# Patient Record
Sex: Female | Born: 1935 | Race: Black or African American | Hispanic: No | Smoking: Never smoker
Health system: Southern US, Community
[De-identification: ages and names within clinical notes are randomized; demographics above are authoritative.]

## PROBLEM LIST (undated history)

## (undated) DIAGNOSIS — I10 Essential (primary) hypertension: Secondary | ICD-10-CM

## (undated) DIAGNOSIS — R42 Dizziness and giddiness: Secondary | ICD-10-CM

## (undated) DIAGNOSIS — J45909 Unspecified asthma, uncomplicated: Secondary | ICD-10-CM

## (undated) DIAGNOSIS — M1990 Unspecified osteoarthritis, unspecified site: Secondary | ICD-10-CM

## (undated) DIAGNOSIS — K219 Gastro-esophageal reflux disease without esophagitis: Secondary | ICD-10-CM

## (undated) DIAGNOSIS — K859 Acute pancreatitis without necrosis or infection, unspecified: Secondary | ICD-10-CM

## (undated) DIAGNOSIS — K5792 Diverticulitis of intestine, part unspecified, without perforation or abscess without bleeding: Secondary | ICD-10-CM

## (undated) DIAGNOSIS — M5430 Sciatica, unspecified side: Secondary | ICD-10-CM

## (undated) DIAGNOSIS — E079 Disorder of thyroid, unspecified: Secondary | ICD-10-CM

## (undated) HISTORY — PX: THYROIDECTOMY: SHX17

---

## 2016-10-30 ENCOUNTER — Observation Stay (HOSPITAL_COMMUNITY)
Admission: EM | Admit: 2016-10-30 | Discharge: 2016-10-31 | Disposition: A | Discharge status: Home / Self Care | Payer: Medicare Other | Attending: Family Medicine | Admitting: Family Medicine

## 2016-10-30 ENCOUNTER — Observation Stay (HOSPITAL_COMMUNITY): Payer: Medicare Other

## 2016-10-30 ENCOUNTER — Encounter (HOSPITAL_COMMUNITY): Payer: Self-pay | Admitting: Emergency Medicine

## 2016-10-30 ENCOUNTER — Emergency Department (HOSPITAL_COMMUNITY): Payer: Medicare Other

## 2016-10-30 ENCOUNTER — Other Ambulatory Visit (HOSPITAL_COMMUNITY): Payer: Self-pay

## 2016-10-30 DIAGNOSIS — R531 Weakness: Secondary | ICD-10-CM | POA: Insufficient documentation

## 2016-10-30 DIAGNOSIS — R945 Abnormal results of liver function studies: Secondary | ICD-10-CM | POA: Diagnosis not present

## 2016-10-30 DIAGNOSIS — Z880 Allergy status to penicillin: Secondary | ICD-10-CM | POA: Diagnosis not present

## 2016-10-30 DIAGNOSIS — Z7982 Long term (current) use of aspirin: Secondary | ICD-10-CM | POA: Diagnosis not present

## 2016-10-30 DIAGNOSIS — J329 Chronic sinusitis, unspecified: Secondary | ICD-10-CM | POA: Insufficient documentation

## 2016-10-30 DIAGNOSIS — R778 Other specified abnormalities of plasma proteins: Secondary | ICD-10-CM | POA: Diagnosis present

## 2016-10-30 DIAGNOSIS — R55 Syncope and collapse: Principal | ICD-10-CM | POA: Insufficient documentation

## 2016-10-30 DIAGNOSIS — Z79899 Other long term (current) drug therapy: Secondary | ICD-10-CM | POA: Insufficient documentation

## 2016-10-30 DIAGNOSIS — R600 Localized edema: Secondary | ICD-10-CM | POA: Diagnosis not present

## 2016-10-30 DIAGNOSIS — K219 Gastro-esophageal reflux disease without esophagitis: Secondary | ICD-10-CM | POA: Insufficient documentation

## 2016-10-30 DIAGNOSIS — R42 Dizziness and giddiness: Secondary | ICD-10-CM | POA: Diagnosis not present

## 2016-10-30 DIAGNOSIS — R7989 Other specified abnormal findings of blood chemistry: Secondary | ICD-10-CM | POA: Diagnosis present

## 2016-10-30 DIAGNOSIS — I10 Essential (primary) hypertension: Secondary | ICD-10-CM | POA: Diagnosis not present

## 2016-10-30 DIAGNOSIS — E039 Hypothyroidism, unspecified: Secondary | ICD-10-CM | POA: Diagnosis not present

## 2016-10-30 DIAGNOSIS — J45909 Unspecified asthma, uncomplicated: Secondary | ICD-10-CM | POA: Insufficient documentation

## 2016-10-30 DIAGNOSIS — R748 Abnormal levels of other serum enzymes: Secondary | ICD-10-CM

## 2016-10-30 HISTORY — DX: Disorder of thyroid, unspecified: E07.9

## 2016-10-30 HISTORY — DX: Sciatica, unspecified side: M54.30

## 2016-10-30 HISTORY — DX: Diverticulitis of intestine, part unspecified, without perforation or abscess without bleeding: K57.92

## 2016-10-30 HISTORY — DX: Essential (primary) hypertension: I10

## 2016-10-30 HISTORY — DX: Unspecified asthma, uncomplicated: J45.909

## 2016-10-30 HISTORY — DX: Gastro-esophageal reflux disease without esophagitis: K21.9

## 2016-10-30 HISTORY — DX: Acute pancreatitis without necrosis or infection, unspecified: K85.90

## 2016-10-30 HISTORY — DX: Dizziness and giddiness: R42

## 2016-10-30 HISTORY — DX: Unspecified osteoarthritis, unspecified site: M19.90

## 2016-10-30 LAB — URINALYSIS, ROUTINE W REFLEX MICROSCOPIC
Bilirubin Urine: NEGATIVE
Glucose, UA: NEGATIVE mg/dL
Hgb urine dipstick: NEGATIVE
Ketones, ur: NEGATIVE mg/dL
LEUKOCYTES UA: NEGATIVE
Nitrite: NEGATIVE
PH: 7 (ref 5.0–8.0)
Protein, ur: NEGATIVE mg/dL
Specific Gravity, Urine: 1.003 — ABNORMAL LOW (ref 1.005–1.030)

## 2016-10-30 LAB — I-STAT TROPONIN, ED
Troponin i, poc: 0 ng/mL (ref 0.00–0.08)
Troponin i, poc: 0.06 ng/mL (ref 0.00–0.08)
Troponin i, poc: 0.09 ng/mL (ref 0.00–0.08)

## 2016-10-30 LAB — BASIC METABOLIC PANEL
Anion gap: 9 (ref 5–15)
BUN: 16 mg/dL (ref 6–20)
CHLORIDE: 99 mmol/L — AB (ref 101–111)
CO2: 25 mmol/L (ref 22–32)
Calcium: 10.6 mg/dL — ABNORMAL HIGH (ref 8.9–10.3)
Creatinine, Ser: 1.39 mg/dL — ABNORMAL HIGH (ref 0.44–1.00)
GFR calc Af Amer: 37 mL/min — ABNORMAL LOW (ref >60)
GFR calc non Af Amer: 32 mL/min — ABNORMAL LOW (ref >60)
Glucose, Bld: 88 mg/dL (ref 65–99)
POTASSIUM: 3.9 mmol/L (ref 3.5–5.1)
SODIUM: 133 mmol/L — AB (ref 135–145)

## 2016-10-30 LAB — HEPATIC FUNCTION PANEL
ALBUMIN: 3.8 g/dL (ref 3.5–5.0)
ALT: 16 U/L (ref 14–54)
AST: 30 U/L (ref 15–41)
Alkaline Phosphatase: 73 U/L (ref 38–126)
Bilirubin, Direct: <0.1 mg/dL — ABNORMAL LOW (ref 0.1–0.5)
TOTAL PROTEIN: 7.4 g/dL (ref 6.5–8.1)
Total Bilirubin: 0.9 mg/dL (ref 0.3–1.2)

## 2016-10-30 LAB — CBC
HEMATOCRIT: 42 % (ref 36.0–46.0)
Hemoglobin: 14.3 g/dL (ref 12.0–15.0)
MCH: 31.1 pg (ref 26.0–34.0)
MCHC: 34 g/dL (ref 30.0–36.0)
MCV: 91.3 fL (ref 78.0–100.0)
Platelets: 247 10*3/uL (ref 150–400)
RBC: 4.6 MIL/uL (ref 3.87–5.11)
RDW: 13.5 % (ref 11.5–15.5)
WBC: 6.9 10*3/uL (ref 4.0–10.5)

## 2016-10-30 LAB — DIFFERENTIAL
Basophils Absolute: 0 10*3/uL (ref 0.0–0.1)
Basophils Relative: 1 %
Eosinophils Absolute: 0.1 10*3/uL (ref 0.0–0.7)
Eosinophils Relative: 2 %
LYMPHS PCT: 22 %
Lymphs Abs: 1.6 10*3/uL (ref 0.7–4.0)
Monocytes Absolute: 0.9 10*3/uL (ref 0.1–1.0)
Monocytes Relative: 13 %
NEUTROS ABS: 4.3 10*3/uL (ref 1.7–7.7)
NEUTROS PCT: 62 %

## 2016-10-30 LAB — T4, FREE: FREE T4: 1.3 ng/dL — AB (ref 0.61–1.12)

## 2016-10-30 LAB — TROPONIN I
Troponin I: 0.07 ng/mL (ref <0.03)
Troponin I: 0.09 ng/mL (ref <0.03)

## 2016-10-30 LAB — TSH: TSH: 0.796 u[IU]/mL (ref 0.350–4.500)

## 2016-10-30 LAB — D-DIMER, QUANTITATIVE: D-Dimer, Quant: 2.06 ug/mL-FEU — ABNORMAL HIGH (ref 0.00–0.50)

## 2016-10-30 LAB — BRAIN NATRIURETIC PEPTIDE: B Natriuretic Peptide: 171.6 pg/mL — ABNORMAL HIGH (ref 0.0–100.0)

## 2016-10-30 MED ORDER — ASPIRIN 81 MG PO CHEW
324.0000 mg | CHEWABLE_TABLET | Freq: Once | ORAL | Status: AC
  Administered 2016-10-30: 324 mg via ORAL
  Filled 2016-10-30: qty 4

## 2016-10-30 MED ORDER — DOXYCYCLINE HYCLATE 100 MG PO TABS
100.0000 mg | ORAL_TABLET | Freq: Two times a day (BID) | ORAL | Status: DC
  Administered 2016-10-31: 100 mg via ORAL
  Filled 2016-10-30: qty 1

## 2016-10-30 MED ORDER — SODIUM CHLORIDE 0.9 % IV BOLUS (SEPSIS)
250.0000 mL | Freq: Once | INTRAVENOUS | Status: AC
  Administered 2016-10-30: 250 mL via INTRAVENOUS

## 2016-10-30 NOTE — ED Notes (Addendum)
Attempted report x 1 to 3E. Per Sunny SchleinFelicia, nurse to call back

## 2016-10-30 NOTE — ED Notes (Signed)
Patient transported to CT 

## 2016-10-30 NOTE — ED Triage Notes (Signed)
Pt coming from home via EMS. Pt started feeling dizzy hot and sweaty last night. Woke up this morning and still feels the same way. Denies SOB, CP. BP 172/100, HR 80, CBG 117, 100% on room air. Pt alert and oriented.

## 2016-10-30 NOTE — H&P (Addendum)
Evelyn Wong ZOX:[id number] DOB: [birth year] DOA: 10/30/2016     PCP: Not local Outpatient Specialists: none Patient coming from:   home Lives   With family    Chief Complaint: Not feeling well  HPI: Evelyn Wong is a 83 y.o. female with medical history significant of Asthma, GERD, HTN, Thyroid disease    Presented with few unwell off that she took a shower yesterday she has been feeling lightheaded and sweaty for the past Night. When she will cough this morning she was still not feeling well she denies chest pain. At night she felt diaphoretic She got a bit better  But this AM she felt light headed.   Called EMS on arrival BP 172/100 heart rates 80 CBG 117 100% room air Her BP was low initially but went up to 190's when she stood up.   Sinus rhythm once a have blood pressure has been erratic for past 2 days going as high as 198 systolic she has had some dyspnea on exertion which is chronic. No otherwise no fever. She's been in bilateral extremity edema and been using compression hose.   At her baseline she is short of breath with minamal ambulation.  Patient is never smoker her mobility is limited by arthritis. NO pleuritic chest pain  Patient traveled from [place] 5 weeks  Regarding pertinent Chronic problems: Denies hx of heart failure, Has hx of Thyroid nodules sp removal x2 currently on Synthroid she has been having thyroid nodule growing again but this time she is choosing observation due to advanced age.  Reports hx of varicose veins on his legs bilaterally for which she uses compression stockings lost lower extremity Doppler was done over a year ago  IN ER:  Temp (24hrs), Avg:98.9 F (37.2 C), Min:98.9 F (37.2 C), Max:98.9 F (37.2 C)      on arrival  ED Triage Vitals  Enc Vitals Group     BP 10/30/16 1015 (!) 165/77     Pulse Rate 10/30/16 1015 67     Resp 10/30/16 1015 16     Temp 10/30/16 1015 98.9 F (37.2 C)     Temp Source 10/30/16 1015 Oral   SpO2 10/30/16 1015 100 %     Weight 10/30/16 1000 180 lb (81.6 kg)     Height 10/30/16 1000 5\' 5"  (1.651 m)     Head Circumference --      Peak Flow --      Pain Score --      Pain Loc --      Pain Edu? --      Excl. in GC? --     Latest RR 16 94% HR 66 BP 161/75 Trop 0.00 >0.06 >0.07  TSH 0.796 Free t4 1.30 slightly elevated  Na 133 K 3.9 Cr 1.39  WBC 6.9 Hg 14.3  BNP 171.6 CT head non acute chronic sphenoid sinusitis CXR non-acute Following Medications were ordered in ER: Medications  aspirin chewable tablet 324 mg (not administered)      Hospitalist was called for admission for elevated troponin and generalized malaise Review of Systems:    Pertinent positives include: fatigue,  Constitutional:  No weight loss, night sweats, Fevers, chills,  weight loss  HEENT:  No headaches, Difficulty swallowing,Tooth/dental problems,Sore throat,  No sneezing, itching, ear ache, nasal congestion, post nasal drip,  Cardio-vascular:  No chest pain, Orthopnea, PND, anasarca, dizziness, palpitations.no Bilateral lower extremity swelling  GI:  No heartburn, indigestion, abdominal pain, nausea, vomiting, diarrhea, change  in bowel habits, loss of appetite, melena, blood in stool, hematemesis Resp:  no shortness of breath at rest. No dyspnea on exertion, No excess mucus, no productive cough, No non-productive cough, No coughing up of blood.No change in color of mucus.No wheezing. Skin:  no rash or lesions. No jaundice GU:  no dysuria, change in color of urine, no urgency or frequency. No straining to urinate.  No flank pain.  Musculoskeletal:  No joint pain or no joint swelling. No decreased range of motion. No back pain.  Psych:  No change in mood or affect. No depression or anxiety. No memory loss.  Neuro: no localizing neurological complaints, no tingling, no weakness, no double vision, no gait abnormality, no slurred speech, no confusion  As per HPI otherwise 10 point review of  systems negative.   Past Medical History: Past Medical History:  Diagnosis Date  . Arthritis   . Asthma   . Diverticulitis   . GERD (gastroesophageal reflux disease)   . Hypertension   . Pancreatitis   . Sciatica   . Thyroid disease   . Vertigo    Past Surgical History:  Procedure Laterality Date  . THYROIDECTOMY       Social History:  Ambulatory independently in the house and  [place] or walker for long distance   reports that she has never smoked. She has never used smokeless tobacco. She reports that she does not drink alcohol or use drugs.  Allergies:   Allergies  Allergen Reactions  . Penicillins Itching and Other (See Comments)    Has patient had a PCN reaction causing immediate rash, facial/tongue/throat swelling, SOB or lightheadedness with hypotension: No Has patient had a PCN reaction causing severe rash involving mucus membranes or skin necrosis: No Has patient had a PCN reaction that required hospitalization: No Has patient had a PCN reaction occurring within the last 10 years: No If all of the above answers are "NO", then may proceed with Cephalosporin use.   Break out with welts  . Tramadol     vomiting       Family History:   Family History  Problem Relation Age of Onset  . ALS Father   . Stroke Other     Medications: Prior to Admission medications   Medication Sig Start Date End Date Taking? Authorizing Provider  albuterol (PROVENTIL HFA;VENTOLIN HFA) 108 (90 Base) MCG/ACT inhaler Inhale 1-2 puffs into the lungs every 6 (six) hours as needed for wheezing or shortness of breath.   Yes [provider]  aspirin (ASPIR-LOW) 81 MG EC tablet Take 81 mg by mouth daily.   Yes [provider]  cetirizine (ZYRTEC) 10 MG tablet Take 10 mg by mouth daily.   Yes [provider]  dexlansoprazole (DEXILANT) 60 MG capsule Take 60 mg by mouth daily.   Yes [provider]  fluticasone furoate-vilanterol (BREO ELLIPTA) 100-25  MCG/INH AEPB Inhale 1 puff into the lungs at bedtime.   Yes [provider]  furosemide (LASIX) 40 MG tablet Take 40 mg by mouth daily.   Yes [provider]  KLOR-CON M20 20 MEQ tablet Take 20 mEq by mouth daily. 10/04/16  Yes [provider]  levothyroxine (SYNTHROID) 25 MCG tablet Take 25 mcg by mouth daily.   Yes [provider]  losartan (COZAAR) 50 MG tablet Take 50 mg by mouth daily.   Yes [provider]  naproxen sodium (ANAPROX) 220 MG tablet Take 220 mg by mouth daily as needed (pain).  Yes [provider]  Pancrelipase, Lip-Prot-Amyl, (ZENPEP) 20000-63000 units CPEP Take 1 capsule by mouth 3 (three) times daily before meals.   Yes [provider]  polyethylene glycol (MIRALAX / GLYCOLAX) packet Take 17 g by mouth at bedtime.    Yes [provider]  spironolactone (ALDACTONE) 25 MG tablet Take 25 mg by mouth daily.   Yes [provider]  tetrahydrozoline 0.05 % ophthalmic solution Place 1 drop into both eyes at bedtime.   Yes [provider]    Physical Exam: Patient Vitals for the past 24 hrs:  BP Temp Temp src Pulse Resp SpO2 Height Weight  10/30/16 1700 (!) 161/75 - - 66 16 94 % - -  10/30/16 1630 (!) 152/73 - - 62 16 95 % - -  10/30/16 1600 (!) 160/97 - - 65 19 98 % - -  10/30/16 1500 (!) 152/76 - - 63 - 98 % - -  10/30/16 1430 (!) 159/79 - - 64 - 100 % - -  10/30/16 1400 (!) 144/76 - - 64 12 95 % - -  10/30/16 1230 (!) 170/92 - - 68 16 100 % - -  10/30/16 1200 (!) 149/80 - - 63 15 98 % - -  10/30/16 1130 (!) 147/90 - - 71 - 98 % - -  10/30/16 1100 (!) 172/82 - - 66 18 99 % - -  10/30/16 1015 (!) 165/77 98.9 F (37.2 C) Oral 67 16 100 % - -  10/30/16 1000 - - - - - - 5\' 5"  (1.651 m) 81.6 kg (180 lb)    1. General:  in No Acute distress  well   appearing 2. Psychological: Alert and Oriented 3. Head/ENT:    Dry Mucous Membranes                          Head Non traumatic, neck  supple, Thyroid nodule palpable                          Poor Dentition 4. SKIN:   decreased Skin turgor,  Skin clean Dry and intact no rash 5. Heart: Regular rate and rhythm systolic Murmur, no Rub or gallop 6. Lungs: Clear to auscultation bilaterally, no wheezes or crackles   7. Abdomen: Soft,  non-tender, Non distended  bowel sounds present 8. Lower extremities: no clubbing, cyanosis, or edema 9. Neurologically   strength 5 out of 5 in all 4 extremities cranial nerves II through XII intact mild nystagmus present 10. MSK: Normal range of motion except for bilateral knees   body mass index is 29.95 kg/m.  Labs on Admission:   Labs on Admission: I have personally reviewed following labs and imaging studies  CBC:  Recent Labs Lab 10/30/16 1021  WBC 6.9  NEUTROABS 4.3  HGB 14.3  HCT 42.0  MCV 91.3  PLT 247   Basic Metabolic Panel:  Recent Labs Lab 10/30/16 1021  NA 133*  K 3.9  CL 99*  CO2 25  GLUCOSE 88  BUN 16  CREATININE 1.39*  CALCIUM 10.6*   GFR: Estimated Creatinine Clearance: 27.2 mL/min (A) (by C-G formula based on SCr of 1.39 mg/dL (H)). Liver Function Tests:  Recent Labs Lab 10/30/16 1146  AST 30  ALT 16  ALKPHOS 73  BILITOT 0.9  PROT 7.4  ALBUMIN 3.8   No results for input(s): LIPASE, AMYLASE in the last 168 hours. No results for  input(s): AMMONIA in the last 168 hours. Coagulation Profile: No results for input(s): INR, PROTIME in the last 168 hours. Cardiac Enzymes:  Recent Labs Lab 10/30/16 1537  TROPONINI 0.07*   BNP (last 3 results) No results for input(s): PROBNP in the last 8760 hours. HbA1C: No results for input(s): HGBA1C in the last 72 hours. CBG: No results for input(s): GLUCAP in the last 168 hours. Lipid Profile: No results for input(s): CHOL, HDL, LDLCALC, TRIG, CHOLHDL, LDLDIRECT in the last 72 hours. Thyroid Function Tests:  Recent Labs  10/30/16 1146  TSH 0.796  FREET4 1.30*   Anemia Panel: No results for  input(s): VITAMINB12, FOLATE, FERRITIN, TIBC, IRON, RETICCTPCT in the last 72 hours. Urine analysis:    Component Value Date/Time   COLORURINE COLORLESS (A) 10/30/2016 1012   APPEARANCEUR CLEAR 10/30/2016 1012   LABSPEC 1.003 (L) 10/30/2016 1012   PHURINE 7.0 10/30/2016 1012   GLUCOSEU NEGATIVE 10/30/2016 1012   HGBUR NEGATIVE 10/30/2016 1012   BILIRUBINUR NEGATIVE 10/30/2016 1012   KETONESUR NEGATIVE 10/30/2016 1012   PROTEINUR NEGATIVE 10/30/2016 1012   NITRITE NEGATIVE 10/30/2016 1012   LEUKOCYTESUR NEGATIVE 10/30/2016 1012   Sepsis Labs: @LABRCNTIP (procalcitonin:4,lacticidven:4) )No results found for this or any previous visit (from the past 240 hour(s)).     UA   no evidence of UTI     No results found for: HGBA1C  Estimated Creatinine Clearance: 27.2 mL/min (A) (by C-G formula based on SCr of 1.39 mg/dL (H)).  BNP (last 3 results) No results for input(s): PROBNP in the last 8760 hours.   ECG REPORT  Independently reviewed Rate: 68  Rhythm: NSR ST&T Change: No acute ischemic changes  QTC 458  Filed Weights   10/30/16 1000  Weight: 81.6 kg (180 lb)     Cultures: No results found for: SDES, SPECREQUEST, CULT, REPTSTATUS   Radiological Exams on Admission: Dg Chest 2 View  Result Date: 10/30/2016 CLINICAL DATA:  Generalized weakness.  Dizziness. EXAM: CHEST  2 VIEW COMPARISON:  None. FINDINGS: Normal sized heart. Clear lungs with normal vascularity. Thoracic spine degenerative changes. IMPRESSION: No acute abnormality. Electronically Signed   By: Beckie SaltsSteven  Reid M.D.   On: 10/30/2016 11:50   Ct Head Wo Contrast  Result Date: 10/30/2016 CLINICAL DATA:  Dizziness, hot and diaphoresis since last night. EXAM: CT HEAD WITHOUT CONTRAST TECHNIQUE: Contiguous axial images were obtained from the base of the skull through the vertex without intravenous contrast. COMPARISON:  None. FINDINGS: Brain: Diffusely enlarged ventricles and subarachnoid spaces. Patchy white matter low  density in both cerebral hemispheres. No intracranial hemorrhage, mass lesion or CT evidence of acute infarction. Vascular: No hyperdense vessel or unexpected calcification. Skull: Bilateral hyperostosis frontalis. Sinuses/Orbits: Sphenoid sinus mucosal thickening and minimal inferior frontal sinus mucosal thickening bilaterally. Other: None. IMPRESSION: 1. No acute abnormality. 2. Mild diffuse cerebral atrophy. 3. Mild chronic small vessel white matter ischemic changes in both cerebral hemispheres. 4. Chronic sphenoid sinusitis and minimal chronic bilateral frontal sinusitis. Electronically Signed   By: Beckie SaltsSteven  Reid M.D.   On: 10/30/2016 15:21    Chart has been reviewed    Assessment/Plan   83 y.o. female with medical history significant of Asthma, GERD, HTN, Thyroid disease  Admitted for elevated troponin and generalized malaise.  Present on Admission: . Bilateral leg edema - most likely secondary to varicose veins continue compression stockings given elevated d-dimer which had Postoperative DVT given recent travel . Elevated troponin - given elevated d-dimer were obtained CT angios were chest rule out  PE continue to cycle cardiac enzymes or echogram notified cardiology. Patient's chronic chest pain free never experienced any significant chest discomfort Lightheadedness - Will obtain echogram, she having vague symptoms no localized neurological findings. Check orthostatics in a.m. Sinusitis possibly contributing to her symptoms - treat with Doxycycline given that patient is Pen allergic  Other plan as per orders.  DVT prophylaxis:    Lovenox     Code Status:  FULL CODE as per patient    Family Communication:   Family  at  Bedside  plan of care was discussed with  Son  Disposition Plan:     To home once workup is complete and patient is stable                     Would benefit from PT/OT eval prior to DC  ordered                                          Consults called: email  cardiology  Admission status:  obs   Level of care   tele     I have spent a total of 56 min on this admission   Khiana Camino 10/30/2016, 9:20 PM    [facility]  Pager [phone]   after 2 AM please page floor coverage PA If 7AM-7PM, please contact the day team taking care of the patient  [email or web address]  Password [email or web address]

## 2016-10-30 NOTE — ED Notes (Signed)
The pts son has gone to get dinner  For the pt  No pain no dizziness

## 2016-10-30 NOTE — ED Provider Notes (Signed)
[facility]-EMERGENCY DEPT Provider Note   CSN: [id number] Arrival date & time: 10/30/16  4098     History   Chief Complaint Chief Complaint  Patient presents with  . Dizziness    HPI Evelyn Wong is a 83 y.o. female.  HPI   83 year old female with a history of hypertension, asthma, hypothyroidism presents with concern for fatigue, generalized weakness. Patient reports last night prior to bed, she had a sensation that she "just didn't feel right" and had diaphoresis. She developed these symptoms after taking a shower. Reports she has some chronic back pain from sciatica and spinal stenosis that was worse at the time. She went to sleep, when she woke up this morning, she still felt funny. Reports "I just don't feel right."   Son reports that her blood pressure was up to 198 systolic. He reports she had normal grip strength, normal gait, was alert and oriented 3, and had no focal signs of weakness. Reports she does have some chronic cough, which is unchanged. Reports dyspnea on exertion which is unchanged. Denies fevers, urinary symptoms, abdominal pain, chest pain. Reports some increased leg swelling over last few weeks, but reports it looks better today and improves with compression hose. Has had dyspnea on exertion for one year.  Has seen doctor about it but it is unchanged.  Past Medical History:  Diagnosis Date  . Arthritis   . Asthma   . Diverticulitis   . GERD (gastroesophageal reflux disease)   . Hypertension   . Pancreatitis   . Sciatica   . Thyroid disease   . Vertigo     There are no active problems to display for this patient.   Past Surgical History:  Procedure Laterality Date  . THYROIDECTOMY      OB History    No data available       Home Medications    Prior to Admission medications   Medication Sig Start Date End Date Taking? Authorizing Provider  albuterol (PROVENTIL HFA;VENTOLIN HFA) 108 (90 Base) MCG/ACT inhaler Inhale 1-2 puffs into the lungs every 6  (six) hours as needed for wheezing or shortness of breath.   Yes [provider]  aspirin (ASPIR-LOW) 81 MG EC tablet Take 81 mg by mouth daily.   Yes [provider]  cetirizine (ZYRTEC) 10 MG tablet Take 10 mg by mouth daily.   Yes [provider]  dexlansoprazole (DEXILANT) 60 MG capsule Take 60 mg by mouth daily.   Yes [provider]  fluticasone furoate-vilanterol (BREO ELLIPTA) 100-25 MCG/INH AEPB Inhale 1 puff into the lungs at bedtime.   Yes [provider]  furosemide (LASIX) 40 MG tablet Take 40 mg by mouth daily.   Yes [provider]  KLOR-CON M20 20 MEQ tablet Take 20 mEq by mouth daily. 10/04/16  Yes [provider]  levothyroxine (SYNTHROID) 25 MCG tablet Take 25 mcg by mouth daily.   Yes [provider]  losartan (COZAAR) 50 MG tablet Take 50 mg by mouth daily.   Yes [provider]  naproxen sodium (ANAPROX) 220 MG tablet Take 220 mg by mouth daily as needed (pain).   Yes [provider]  Pancrelipase, Lip-Prot-Amyl, (ZENPEP) 20000-63000 units CPEP Take 1 capsule by mouth 3 (three) times daily before meals.   Yes [provider]  polyethylene glycol (MIRALAX / GLYCOLAX) packet Take 17 g by mouth at bedtime.    Yes [provider]  spironolactone (ALDACTONE) 25 MG tablet Take 25 mg by mouth  daily.   Yes [provider]  tetrahydrozoline 0.05 % ophthalmic solution Place 1 drop into both eyes at bedtime.   Yes [provider]    Family History History reviewed. No pertinent family history.  Social History Social History  Substance Use Topics  . Smoking status: Never Smoker  . Smokeless tobacco: Never Used  . Alcohol use No     Allergies   Penicillins and Tramadol   Review of Systems Review of Systems  Constitutional: Positive for diaphoresis and fatigue. Negative for fever.  HENT: Negative for sore throat.   Eyes: Negative for visual  disturbance.  Respiratory: Positive for cough (chronic throat clearing). Negative for shortness of breath.   Cardiovascular: Positive for leg swelling (reports chronic but worse over few weeks). Negative for chest pain and palpitations.  Gastrointestinal: Negative for abdominal pain, constipation (take miralax), diarrhea, nausea and vomiting.  Genitourinary: Negative for difficulty urinating and dysuria.  Musculoskeletal: Positive for back pain (chronic spinal stenosis). Negative for neck pain.  Skin: Negative for rash.  Neurological: Negative for dizziness, syncope, facial asymmetry, speech difficulty, weakness, numbness and headaches.     Physical Exam Updated Vital Signs BP (!) 161/75   Pulse 66   Temp 98.9 F (37.2 C) (Oral)   Resp 16   Ht 5\' 5"  (1.651 m)   Wt 81.6 kg (180 lb)   SpO2 94%   BMI 29.95 kg/m   Physical Exam  Constitutional: She is oriented to person, place, and time. She appears well-developed and well-nourished. No distress.  HENT:  Head: Normocephalic and atraumatic.  Eyes: Conjunctivae and EOM are normal.  Poor perihperal vision, reports has had this  Neck: Normal range of motion.  Right anterior neck mass  Cardiovascular: Normal rate, regular rhythm, normal heart sounds and intact distal pulses.  Exam reveals no gallop and no friction rub.   No murmur heard. Pulmonary/Chest: Effort normal and breath sounds normal. No respiratory distress. She has no wheezes. She has no rales.  Abdominal: Soft. She exhibits no distension. There is no tenderness. There is no guarding.  Musculoskeletal: She exhibits no edema or tenderness.  Neurological: She is alert and oriented to person, place, and time. She has normal strength. No cranial nerve deficit or sensory deficit. Coordination normal. GCS eye subscore is 4. GCS verbal subscore is 5. GCS motor subscore is 6.  Skin: Skin is warm and dry. No rash noted. She is not diaphoretic. No erythema.  Nursing note and vitals  reviewed.    ED Treatments / Results  Labs (all labs ordered are listed, but only abnormal results are displayed) Labs Reviewed  BASIC METABOLIC PANEL - Abnormal; Notable for the following:       Result Value   Sodium 133 (*)    Chloride 99 (*)    Creatinine, Ser 1.39 (*)    Calcium 10.6 (*)    GFR calc non Af Amer 32 (*)    GFR calc Af Amer 37 (*)    All other components within normal limits  URINALYSIS, ROUTINE W REFLEX MICROSCOPIC - Abnormal; Notable for the following:    Color, Urine COLORLESS (*)    Specific Gravity, Urine 1.003 (*)    All other components within normal limits  T4, FREE - Abnormal; Notable for the following:    Free T4 1.30 (*)    All other components within normal limits  HEPATIC FUNCTION PANEL - Abnormal; Notable for the following:    Bilirubin, Direct <0.1 (*)  All other components within normal limits  TROPONIN I - Abnormal; Notable for the following:    Troponin I 0.07 (*)    All other components within normal limits  BRAIN NATRIURETIC PEPTIDE - Abnormal; Notable for the following:    B Natriuretic Peptide 171.6 (*)    All other components within normal limits  CBC  TSH  DIFFERENTIAL  CBC WITH DIFFERENTIAL/PLATELET  I-STAT TROPONIN, ED  I-STAT TROPONIN, ED  I-STAT TROPONIN, ED    EKG  EKG Interpretation  Date/Time:  Saturday October 30 2016 10:14:45 EDT Ventricular Rate:  68 PR Interval:    QRS Duration: 104 QT Interval:  430 QTC Calculation: 458 R Axis:   78 Text Interpretation:  Sinus rhythm Nonspecific T abnormalities, lateral leads No previous ECGs available Confirmed by Alvira Monday ([id number]) on 10/30/2016 10:30:34 AM       Radiology Dg Chest 2 View  Result Date: 10/30/2016 CLINICAL DATA:  Generalized weakness.  Dizziness. EXAM: CHEST  2 VIEW COMPARISON:  None. FINDINGS: Normal sized heart. Clear lungs with normal vascularity. Thoracic spine degenerative changes. IMPRESSION: No acute abnormality. Electronically Signed    By: Beckie Salts M.D.   On: 10/30/2016 11:50   Ct Head Wo Contrast  Result Date: 10/30/2016 CLINICAL DATA:  Dizziness, hot and diaphoresis since last night. EXAM: CT HEAD WITHOUT CONTRAST TECHNIQUE: Contiguous axial images were obtained from the base of the skull through the vertex without intravenous contrast. COMPARISON:  None. FINDINGS: Brain: Diffusely enlarged ventricles and subarachnoid spaces. Patchy white matter low density in both cerebral hemispheres. No intracranial hemorrhage, mass lesion or CT evidence of acute infarction. Vascular: No hyperdense vessel or unexpected calcification. Skull: Bilateral hyperostosis frontalis. Sinuses/Orbits: Sphenoid sinus mucosal thickening and minimal inferior frontal sinus mucosal thickening bilaterally. Other: None. IMPRESSION: 1. No acute abnormality. 2. Mild diffuse cerebral atrophy. 3. Mild chronic small vessel white matter ischemic changes in both cerebral hemispheres. 4. Chronic sphenoid sinusitis and minimal chronic bilateral frontal sinusitis. Electronically Signed   By: Beckie Salts M.D.   On: 10/30/2016 15:21    Procedures Procedures (including critical care time)  Medications Ordered in ED Medications  aspirin chewable tablet 324 mg (not administered)     Initial Impression / Assessment and Plan / ED Course  I have reviewed the triage vital signs and the nursing notes.  Pertinent labs & imaging results that were available during my care of the patient were reviewed by me and considered in my medical decision making (see chart for details).     83 year old female with a history of hypertension, asthma, hypothyroidism presents with concern for fatigue, generalized weakness.  Reports episode of diaphoresis last night and feels unwell this AM.  Labs showed no sign of anemia. She has no leukocytosis, no sign of infection, including no UTI and urinalysis, and normal chest x-ray.  No significant electrolyte abnormalities. Unknown baseline Cr.   Initial EKG and troponin without significant changes.  Son describes normal gait, pt without neurologic abnormalities on exam with exception of visual fields which have been present prior.  Given unchanged neurologic exam, normal finger to nose, feel CVA is unlikely by exam.  Given "head feeling funny" did order Head CT which shows no significant acute abnormalities.  Second troponin increasing to .06. Pt reports only one second of Cp while in the ED that felt like "my arthritis." Given trend in troponin in setting of pt with generalized weakness and diaphoresis last night, will check another troponin. Dr. Adela Lank assuming care when  third troponin returned positive. Discussed with family. Gave aspirin. Dr. Adela LankFloyd to admit for continued observation.   Final Clinical Impressions(s) / ED Diagnoses   Final diagnoses:  Elevated troponin  Generalized weakness    New Prescriptions New Prescriptions   No medications on file     Alvira MondaySchlossman, Layal Javid, MD 10/30/16 1845

## 2016-10-30 NOTE — ED Notes (Signed)
Pt wants to eat  edp okd

## 2016-10-31 ENCOUNTER — Observation Stay (HOSPITAL_BASED_OUTPATIENT_CLINIC_OR_DEPARTMENT_OTHER): Payer: Medicare Other

## 2016-10-31 ENCOUNTER — Observation Stay (HOSPITAL_COMMUNITY): Payer: Medicare Other

## 2016-10-31 DIAGNOSIS — R600 Localized edema: Secondary | ICD-10-CM | POA: Diagnosis not present

## 2016-10-31 DIAGNOSIS — R609 Edema, unspecified: Secondary | ICD-10-CM

## 2016-10-31 DIAGNOSIS — I119 Hypertensive heart disease without heart failure: Secondary | ICD-10-CM

## 2016-10-31 DIAGNOSIS — R748 Abnormal levels of other serum enzymes: Secondary | ICD-10-CM | POA: Diagnosis not present

## 2016-10-31 DIAGNOSIS — M79609 Pain in unspecified limb: Secondary | ICD-10-CM | POA: Diagnosis not present

## 2016-10-31 DIAGNOSIS — R42 Dizziness and giddiness: Secondary | ICD-10-CM | POA: Diagnosis not present

## 2016-10-31 DIAGNOSIS — I340 Nonrheumatic mitral (valve) insufficiency: Secondary | ICD-10-CM

## 2016-10-31 DIAGNOSIS — R55 Syncope and collapse: Secondary | ICD-10-CM | POA: Diagnosis not present

## 2016-10-31 DIAGNOSIS — I351 Nonrheumatic aortic (valve) insufficiency: Secondary | ICD-10-CM

## 2016-10-31 LAB — COMPREHENSIVE METABOLIC PANEL
ALBUMIN: 3.2 g/dL — AB (ref 3.5–5.0)
ALT: 13 U/L — ABNORMAL LOW (ref 14–54)
AST: 30 U/L (ref 15–41)
Alkaline Phosphatase: 57 U/L (ref 38–126)
Anion gap: 8 (ref 5–15)
BILIRUBIN TOTAL: 1 mg/dL (ref 0.3–1.2)
BUN: 14 mg/dL (ref 6–20)
CO2: 24 mmol/L (ref 22–32)
Calcium: 10.4 mg/dL — ABNORMAL HIGH (ref 8.9–10.3)
Chloride: 106 mmol/L (ref 101–111)
Creatinine, Ser: 1.24 mg/dL — ABNORMAL HIGH (ref 0.44–1.00)
GFR calc Af Amer: 42 mL/min — ABNORMAL LOW (ref >60)
GFR calc non Af Amer: 37 mL/min — ABNORMAL LOW (ref >60)
GLUCOSE: 80 mg/dL (ref 65–99)
POTASSIUM: 3.8 mmol/L (ref 3.5–5.1)
Sodium: 138 mmol/L (ref 135–145)
Total Protein: 6.4 g/dL — ABNORMAL LOW (ref 6.5–8.1)

## 2016-10-31 LAB — ECHOCARDIOGRAM COMPLETE
CHL CUP DOP CALC LVOT VTI: 22.8 cm
E decel time: 320 msec
EERAT: 12.09
FS: 30 % (ref 28–44)
Height: 65 in
IVS/LV PW RATIO, ED: 1.1
LA vol index: 34.8 mL/m2
LADIAMINDEX: 2.16 cm/m2
LASIZE: 41 mm
LAVOL: 66.1 mL
LAVOLA4C: 76.1 mL
LEFT ATRIUM END SYS DIAM: 41 mm
LV E/e'average: 12.09
LV PW d: 10 mm — AB (ref 0.6–1.1)
LV TDI E'LATERAL: 5.84
LVEEMED: 12.09
LVELAT: 5.84 cm/s
LVOT area: 2.84 cm2
LVOT diameter: 19 mm
LVOT peak grad rest: 4 mmHg
LVOT peak vel: 101 cm/s
LVOTSV: 65 mL
MV Dec: 320
MV pk E vel: 70.6 m/s
MVPKAVEL: 57.4 m/s
RV LATERAL S' VELOCITY: 15.7 cm/s
RV sys press: 33 mmHg
Reg peak vel: 273 cm/s
TDI e' medial: 6.16
TRMAXVEL: 273 cm/s
Weight: 2910.4 oz

## 2016-10-31 LAB — CBC
HEMATOCRIT: 40.6 % (ref 36.0–46.0)
Hemoglobin: 13.8 g/dL (ref 12.0–15.0)
MCH: 31.4 pg (ref 26.0–34.0)
MCHC: 34 g/dL (ref 30.0–36.0)
MCV: 92.3 fL (ref 78.0–100.0)
Platelets: 231 10*3/uL (ref 150–400)
RBC: 4.4 MIL/uL (ref 3.87–5.11)
RDW: 13.7 % (ref 11.5–15.5)
WBC: 6.4 10*3/uL (ref 4.0–10.5)

## 2016-10-31 LAB — MAGNESIUM: MAGNESIUM: 2 mg/dL (ref 1.7–2.4)

## 2016-10-31 LAB — HEMOGLOBIN A1C
Hgb A1c MFr Bld: 5.5 % (ref 4.8–5.6)
Mean Plasma Glucose: 111.15 mg/dL

## 2016-10-31 LAB — PHOSPHORUS: PHOSPHORUS: 2.9 mg/dL (ref 2.5–4.6)

## 2016-10-31 LAB — TROPONIN I
TROPONIN I: 0.09 ng/mL — AB (ref <0.03)
Troponin I: 0.11 ng/mL (ref <0.03)

## 2016-10-31 LAB — GLUCOSE, CAPILLARY: GLUCOSE-CAPILLARY: 84 mg/dL (ref 65–99)

## 2016-10-31 MED ORDER — OXYCODONE-ACETAMINOPHEN 5-325 MG PO TABS: 1.0000 | ORAL_TABLET | Freq: Four times a day (QID) | ORAL | Status: DC | PRN

## 2016-10-31 MED ORDER — ASPIRIN EC 81 MG PO TBEC
81.0000 mg | DELAYED_RELEASE_TABLET | Freq: Every day | ORAL | Status: DC
  Administered 2016-10-31: 81 mg via ORAL
  Filled 2016-10-31: qty 1

## 2016-10-31 MED ORDER — HYDROCODONE-ACETAMINOPHEN 5-325 MG PO TABS: 1.0000 | ORAL_TABLET | ORAL | Status: DC | PRN

## 2016-10-31 MED ORDER — IOPAMIDOL (ISOVUE-370) INJECTION 76%
80.0000 mL | Freq: Once | INTRAVENOUS | Status: AC | PRN
  Administered 2016-10-31: 100 mL via INTRAVENOUS

## 2016-10-31 MED ORDER — LEVOTHYROXINE SODIUM 25 MCG PO TABS: 25.0000 ug | ORAL_TABLET | Freq: Every day | ORAL | Status: DC

## 2016-10-31 MED ORDER — DOXYCYCLINE HYCLATE 100 MG PO TABS: 100.0000 mg | ORAL_TABLET | Freq: Two times a day (BID) | ORAL | 0 refills | Status: AC

## 2016-10-31 MED ORDER — PANTOPRAZOLE SODIUM 40 MG PO TBEC
40.0000 mg | DELAYED_RELEASE_TABLET | Freq: Every day | ORAL | Status: DC
  Administered 2016-10-31: 40 mg via ORAL
  Filled 2016-10-31: qty 1

## 2016-10-31 MED ORDER — FLUTICASONE FUROATE-VILANTEROL 100-25 MCG/INH IN AEPB
1.0000 | INHALATION_SPRAY | Freq: Every day | RESPIRATORY_TRACT | Status: DC
  Filled 2016-10-31 (×2): qty 28

## 2016-10-31 MED ORDER — ACETAMINOPHEN 650 MG RE SUPP: 650.0000 mg | Freq: Four times a day (QID) | RECTAL | Status: DC | PRN

## 2016-10-31 MED ORDER — ACETAMINOPHEN 325 MG PO TABS: 650.0000 mg | ORAL_TABLET | Freq: Four times a day (QID) | ORAL | Status: DC | PRN

## 2016-10-31 MED ORDER — LOSARTAN POTASSIUM 50 MG PO TABS
50.0000 mg | ORAL_TABLET | Freq: Every day | ORAL | Status: DC
  Administered 2016-10-31: 50 mg via ORAL
  Filled 2016-10-31: qty 1

## 2016-10-31 MED ORDER — ONDANSETRON HCL 4 MG/2ML IJ SOLN: 4.0000 mg | Freq: Four times a day (QID) | INTRAMUSCULAR | Status: DC | PRN

## 2016-10-31 MED ORDER — LORATADINE 10 MG PO TABS
10.0000 mg | ORAL_TABLET | Freq: Every day | ORAL | Status: DC
  Administered 2016-10-31: 10 mg via ORAL
  Filled 2016-10-31: qty 1

## 2016-10-31 MED ORDER — ENOXAPARIN SODIUM 30 MG/0.3ML ~~LOC~~ SOLN
30.0000 mg | SUBCUTANEOUS | Status: DC
  Administered 2016-10-31: 30 mg via SUBCUTANEOUS
  Filled 2016-10-31: qty 0.3

## 2016-10-31 MED ORDER — SPIRONOLACTONE 25 MG PO TABS: 25.0000 mg | ORAL_TABLET | Freq: Every day | ORAL | Status: DC

## 2016-10-31 MED ORDER — FUROSEMIDE 40 MG PO TABS
40.0000 mg | ORAL_TABLET | Freq: Every day | ORAL | Status: DC
Start: 2016-10-31 — End: 2016-10-31

## 2016-10-31 MED ORDER — POLYETHYLENE GLYCOL 3350 17 G PO PACK: 17.0000 g | PACK | Freq: Every day | ORAL | Status: DC

## 2016-10-31 MED ORDER — SODIUM CHLORIDE 0.9 % IV SOLN
INTRAVENOUS | Status: AC
  Administered 2016-10-31: 02:00:00 via INTRAVENOUS

## 2016-10-31 MED ORDER — ONDANSETRON HCL 4 MG PO TABS: 4.0000 mg | ORAL_TABLET | Freq: Four times a day (QID) | ORAL | Status: DC | PRN

## 2016-10-31 MED ORDER — PANCRELIPASE (LIP-PROT-AMYL) 12000-38000 UNITS PO CPEP: 24000.0000 [IU] | ORAL_CAPSULE | Freq: Three times a day (TID) | ORAL | Status: DC

## 2016-10-31 MED ORDER — FUROSEMIDE 40 MG PO TABS: 40.0000 mg | ORAL_TABLET | Freq: Every day | ORAL | Status: DC

## 2016-10-31 NOTE — Consult Note (Signed)
Cardiology Consultation:   Patient ID: Evelyn Wong; [id number]; [birth year]   Admit date: 10/30/2016 Date of Consult: 10/31/2016  Primary Care Provider: Patient, No Pcp Per Primary Cardiologist: New   Patient Profile:   Evelyn Wong is a 83 y.o. female with a hx of HTN who is being seen today for the evaluation of elevated Troponin at the request of Dr Laural Benes.  History of Present Illness:   Evelyn Wong is a delightful 83 y/o female who lives in [place] with her daughter. She was widowed in 2014. She has 3 sons, a physician who lives in [place], a symphony Programmer, multimedia who lives in [place], and an occupational therapist who lives in [place]. She spends 3 months with each. She has been here for about a month. She has HTN, GERD, hypothyroidism, chronic venous edema, and chronic DOE "asthma".   She was in her usual state of health till last PM when she felt "funny, just not right" after bathing. When she was putting on her support stockings she noted she was sweating, unusual for her. She came to the ED via EMS. Her B/P was elevated at the scene 198/110. Since admission her Troponin's have come back elevated-0.09-.11- 0.09. She denies any chest pain. She says she had a remote stress test that was OK but never admitted for any cardiac issues. She feels well this am.   Past Medical History:  Diagnosis Date  . Arthritis   . Asthma   . Diverticulitis   . GERD (gastroesophageal reflux disease)   . Hypertension   . Pancreatitis   . Sciatica   . Thyroid disease   . Vertigo     Past Surgical History:  Procedure Laterality Date  . THYROIDECTOMY       Home Medications:  Prior to Admission medications   Medication Sig Start Date End Date Taking? Authorizing Provider  albuterol (PROVENTIL HFA;VENTOLIN HFA) 108 (90 Base) MCG/ACT inhaler Inhale 1-2 puffs into the lungs every 6 (six) hours as needed for wheezing or shortness of breath.   Yes [provider]  aspirin (ASPIR-LOW) 81  MG EC tablet Take 81 mg by mouth daily.   Yes [provider]  cetirizine (ZYRTEC) 10 MG tablet Take 10 mg by mouth daily.   Yes [provider]  dexlansoprazole (DEXILANT) 60 MG capsule Take 60 mg by mouth daily.   Yes [provider]  fluticasone furoate-vilanterol (BREO ELLIPTA) 100-25 MCG/INH AEPB Inhale 1 puff into the lungs at bedtime.   Yes [provider]  furosemide (LASIX) 40 MG tablet Take 40 mg by mouth daily.   Yes [provider]  KLOR-CON M20 20 MEQ tablet Take 20 mEq by mouth daily. 10/04/16  Yes [provider]  levothyroxine (SYNTHROID) 25 MCG tablet Take 25 mcg by mouth daily.   Yes [provider]  losartan (COZAAR) 50 MG tablet Take 50 mg by mouth daily.   Yes [provider]  naproxen sodium (ANAPROX) 220 MG tablet Take 220 mg by mouth daily as needed (pain).   Yes [provider]  Pancrelipase, Lip-Prot-Amyl, (ZENPEP) 20000-63000 units CPEP Take 1 capsule by mouth 3 (three) times daily before meals.   Yes [provider]  polyethylene glycol (MIRALAX / GLYCOLAX) packet Take 17 g by mouth at bedtime.    Yes [provider]  spironolactone (ALDACTONE) 25 MG tablet Take 25 mg by mouth daily.   Yes [provider]  tetrahydrozoline 0.05 % ophthalmic solution Place 1 drop into both  eyes at bedtime.   Yes [provider]    Inpatient Medications: Scheduled Meds: . aspirin EC  81 mg Oral Daily  . doxycycline  100 mg Oral Q12H  . enoxaparin (LOVENOX) injection  30 mg Subcutaneous Q24H  . fluticasone furoate-vilanterol  1 puff Inhalation Daily  . [START ON 11/01/2016] furosemide  40 mg Oral Daily  . [START ON 11/01/2016] levothyroxine  25 mcg Oral QAC breakfast  . lipase/protease/amylase  24,000 Units Oral TID AC  . loratadine  10 mg Oral Daily  . losartan  50 mg Oral Daily  . pantoprazole  40 mg Oral Daily  . polyethylene glycol  17 g Oral QHS  . [START ON  11/01/2016] spironolactone  25 mg Oral Daily   Continuous Infusions:  PRN Meds: acetaminophen **OR** acetaminophen, ondansetron **OR** ondansetron (ZOFRAN) IV, oxyCODONE-acetaminophen  Allergies:    Allergies  Allergen Reactions  . Penicillins Itching and Other (See Comments)    Has patient had a PCN reaction causing immediate rash, facial/tongue/throat swelling, SOB or lightheadedness with hypotension: No Has patient had a PCN reaction causing severe rash involving mucus membranes or skin necrosis: No Has patient had a PCN reaction that required hospitalization: No Has patient had a PCN reaction occurring within the last 10 years: No If all of the above answers are "NO", then may proceed with Cephalosporin use.   Break out with welts  . Tramadol     vomiting    Social History:   Social History   Social History  . Marital status: Widowed    Spouse name: N/A  . Number of children: N/A  . Years of education: N/A   Occupational History  . Not on file.   Social History Main Topics  . Smoking status: Never Smoker  . Smokeless tobacco: Never Used  . Alcohol use No  . Drug use: No  . Sexual activity: Not on file   Other Topics Concern  . Not on file   Social History Narrative  . No narrative on file    Family History:    Family History  Problem Relation Age of Onset  . ALS Father   . Stroke Other      ROS:  Please see the history of present illness.  ROS  She has a goiter,  All other ROS reviewed and negative.     Physical Exam/Data:   Vitals:   10/30/16 2200 10/30/16 2306 10/31/16 0529 10/31/16 0809  BP: 120/76 (!) 164/77 120/70 (!) 144/74  Pulse: 64 63 (!) 56 64  Resp: 14 16 18    Temp:  98.2 F (36.8 C) 98.7 F (37.1 C)   TempSrc:  Oral Oral   SpO2: 98% 100% 98% 100%  Weight:  182 lb 7 oz (82.8 kg) 181 lb 14.4 oz (82.5 kg)   Height:  5\' 5"  (1.651 m)      Intake/Output Summary (Last 24 hours) at 10/31/16 1122 Last data filed at 10/31/16 0902   Gross per 24 hour  Intake              240 ml  Output                0 ml  Net              240 ml   Filed Weights   10/30/16 1000 10/30/16 2306 10/31/16 0529  Weight: 180 lb (81.6 kg) 182 lb 7 oz (82.8 kg) 181 lb 14.4 oz (82.5 kg)   Body  mass index is 30.27 kg/m.  General:  Well nourished, well developed, in no acute distress HEENT: normal, glasses Lymph: no adenopathy Neck: no JVD, goiter Rt neck Endocrine:  No thryomegaly Vascular: No carotid bruits; FA pulses 2+ bilaterally without bruits  Cardiac:  normal S1, S2; RRR; no murmur  Lungs:  clear to auscultation bilaterally, no wheezing, rhonchi or rales  Abd: soft, nontender, no hepatomegaly  Ext: no edema, compression stockings in place Musculoskeletal:  No deformities, BUE and BLE strength normal and equal Skin: warm and dry  Neuro:  CNs 2-12 intact, no focal abnormalities noted Psych:  Normal affect   EKG:  The EKG was personally reviewed and demonstrates:  NSR Telemetry:  Telemetry was personally reviewed and demonstrates:  NSR  Relevant CV Studies: Echo-pending  Laboratory Data:  Chemistry Recent Labs Lab 10/30/16 1021 10/31/16 0711  NA 133* 138  K 3.9 3.8  CL 99* 106  CO2 25 24  GLUCOSE 88 80  BUN 16 14  CREATININE 1.39* 1.24*  CALCIUM 10.6* 10.4*  GFRNONAA 32* 37*  GFRAA 37* 42*  ANIONGAP 9 8     Recent Labs Lab 10/30/16 1146 10/31/16 0711  PROT 7.4 6.4*  ALBUMIN 3.8 3.2*  AST 30 30  ALT 16 13*  ALKPHOS 73 57  BILITOT 0.9 1.0   Hematology Recent Labs Lab 10/30/16 1021 10/31/16 0711  WBC 6.9 6.4  RBC 4.60 4.40  HGB 14.3 13.8  HCT 42.0 40.6  MCV 91.3 92.3  MCH 31.1 31.4  MCHC 34.0 34.0  RDW 13.5 13.7  PLT 247 231   Cardiac Enzymes Recent Labs Lab 10/30/16 1537 10/30/16 1924 10/31/16 0207 10/31/16 0711  TROPONINI 0.07* 0.09* 0.11* 0.09*    Recent Labs Lab 10/30/16 1042 10/30/16 1454 10/30/16 1932  TROPIPOC 0.00 0.06 0.09*    BNP Recent Labs Lab 10/30/16 1021  BNP  171.6*    DDimer  Recent Labs Lab 10/30/16 1924  DDIMER 2.06*    Radiology/Studies:  Dg Chest 2 View  Result Date: 10/30/2016 CLINICAL DATA:  Generalized weakness.  Dizziness. EXAM: CHEST  2 VIEW COMPARISON:  None. FINDINGS: Normal sized heart. Clear lungs with normal vascularity. Thoracic spine degenerative changes. IMPRESSION: No acute abnormality. Electronically Signed   By: Beckie Salts M.D.   On: 10/30/2016 11:50   Ct Head Wo Contrast  Result Date: 10/30/2016 CLINICAL DATA:  Dizziness, hot and diaphoresis since last night. EXAM: CT HEAD WITHOUT CONTRAST TECHNIQUE: Contiguous axial images were obtained from the base of the skull through the vertex without intravenous contrast. COMPARISON:  None. FINDINGS: Brain: Diffusely enlarged ventricles and subarachnoid spaces. Patchy white matter low density in both cerebral hemispheres. No intracranial hemorrhage, mass lesion or CT evidence of acute infarction. Vascular: No hyperdense vessel or unexpected calcification. Skull: Bilateral hyperostosis frontalis. Sinuses/Orbits: Sphenoid sinus mucosal thickening and minimal inferior frontal sinus mucosal thickening bilaterally. Other: None. IMPRESSION: 1. No acute abnormality. 2. Mild diffuse cerebral atrophy. 3. Mild chronic small vessel white matter ischemic changes in both cerebral hemispheres. 4. Chronic sphenoid sinusitis and minimal chronic bilateral frontal sinusitis. Electronically Signed   By: Beckie Salts M.D.   On: 10/30/2016 15:21   Ct Angio Chest Pe W And/or Wo Contrast  Result Date: 10/31/2016 CLINICAL DATA:  83 y/o F; PE suspected, intermediate prob, positive D-dimer. EXAM: CT ANGIOGRAPHY CHEST WITH CONTRAST TECHNIQUE: Multidetector CT imaging of the chest was performed using the standard protocol during bolus administration of intravenous contrast. Multiplanar CT image reconstructions and MIPs were obtained  to evaluate the vascular anatomy. CONTRAST:  60cc Isovue 370 COMPARISON:  None.  FINDINGS: Cardiovascular: Satisfactory opacification of the pulmonary arteries to the segmental level. No evidence of pulmonary embolism. Normal heart size. No pericardial effusion. Mild calcific atherosclerosis of the thoracic aorta. Mediastinum/Nodes: Partially visualized large right lobe of thyroid. No mediastinal lymphadenopathy. Normal thoracic esophagus. Calcified left hilar lymph nodes. The Lungs/Pleura: Scattered calcified granulomas in the lungs. No consolidation, effusion, or pneumothorax. Upper Abdomen: Partially visualized cysts in the right renal fossa measuring at least 11.7 cm probably arising from the kidney. Musculoskeletal: No chest wall abnormality. No acute or significant osseous findings. Moderate discogenic degenerative changes of the thoracic spine. Review of the MIP images confirms the above findings. IMPRESSION: 1. No pulmonary embolus identified. 2. Sequelae of granulomatous disease with calcified left hilar lymph nodes and scattered calcified pulmonary granuloma. 3. Otherwise unremarkable CTA of the chest for age. Electronically Signed   By: Mitzi HansenLance  Furusawa-Stratton M.D.   On: 10/31/2016 02:23    Assessment and Plan:   Elevated Troponin- Trivial elevation setting of  Accelerated HTN- no chest pain  Hypertenisive cardiovascular disease On good medications at home  Chronic venous insufficiency compliant with compression stockings   Plan: MD to see-will see how she does when she gets up but she is currently symptom free.  Signed, Corine ShelterLuke Kilroy, PA-C  10/31/2016 11:22 AM   I have seen, examined the patient, and reviewed the above assessment and plan.  On exam, RRR.  Changes to above are made where necessary.  Dopplers and CTA unrevealing.  Tn not suggestive of acute MI.  I suspect hypertensive event with cardiac strain.  Given advanced age, conservative approach is advised.  Avoid NSAIDs.  2 gram sodium diet.  If echo is low risk, OK to discharge to home from my  standpoint.  Co Sign: Hillis RangeJames Blondina Coderre, MD 10/31/2016 1:15 PM

## 2016-10-31 NOTE — Discharge Summary (Signed)
Physician Discharge Summary  Evelyn Wong ZOX:[id number] DOB: Nov 08, 1922 DOA: 10/30/2016  [place] of state  Admit date: 10/30/2016 Discharge date: 10/31/2016  Admitted From: Home  Disposition: Home   Recommendations for Outpatient Follow-up:  1. Follow up with PCP in 1-2 weeks 2. Avoid NSAIDS  Discharge Condition: STABLE   CODE STATUS: FULL    Brief Hospitalization Summary: Please see all hospital notes, images, labs for full details of the hospitalization.  From HPI:  Evelyn RudMarcella Kahre is a 83 y.o. female with medical history significant of Asthma, GERD, HTN, Thyroid disease who presented with complaints of feeling unwell after she took a shower yesterday she has been feeling lightheaded and sweaty for the past Night. When she will cough this morning she was still not feeling well she denies chest pain. At night she felt diaphoretic She got a bit better  But this AM she felt light headed.  Family called EMS on arrival BP 172/100 heart rates 80 CBG 117 100% room air.  Her BP was low initially but went up to 190's when she stood up.   Sinus rhythm and blood pressure has been erratic for past 2 days going as high as 198 systolic she has had some dyspnea on exertion which is chronic. No otherwise no fever. She's been in bilateral extremity edema and been using compression hose.   At her baseline she is short of breath with minamal ambulation. Patient is never smoker her mobility is limited by arthritis. NO pleuritic chest pain. Patient traveled from [place] 5 weeks  Regarding pertinent Chronic problems: Denies hx of heart failure, Has hx of Thyroid nodules sp removal x2 currently on Synthroid she has been having thyroid nodule growing again but this time she is choosing observation due to advanced age.  Reports hx of varicose veins on his legs bilaterally for which she uses compression stockings lost lower extremity Doppler was done over a year ago  Pt was admitted for observation, serial  troponins measured and continuous telemetry monitoring performed.  Pt was seen by cardiology in consultation.  Pt was noted to have a mild sinusitis and started on oral doxycycline.  Pt restarted on home medications.  Pt had an echocardiogram with normal EF, grade 2 DD, negative doppler studies of Lower extremities (no DVT found).  Pt also had a CTA chest with no findings of PE or pneumonia (see full report below).  Pt's blood pressure rapidly improved, restarted on home medications, seen by cardiology and deemed stable to discharge.  Cardiology felt that mild bump in troponin was secondary to accelerated hypertension.  They recommended conservative management with 2 gm sodium diet, avoiding NSAIDS, no changes to medication regimen made.    Discharge Diagnoses:  Active Problems:   Postural dizziness with presyncope   Bilateral leg edema   Elevated troponin  Discharge Instructions: Discharge Instructions    Call MD for:  difficulty breathing, headache or visual disturbances    Complete by:  As directed    Call MD for:  extreme fatigue    Complete by:  As directed    Call MD for:  persistant dizziness or light-headedness    Complete by:  As directed    Call MD for:  persistant nausea and vomiting    Complete by:  As directed    Call MD for:  severe uncontrolled pain    Complete by:  As directed    Call MD for:  temperature >100.4    Complete by:  As directed  Diet - low sodium heart healthy    Complete by:  As directed    Increase activity slowly    Complete by:  As directed      Allergies as of 10/31/2016      Reactions   Penicillins Itching, Other (See Comments)   Has patient had a PCN reaction causing immediate rash, facial/tongue/throat swelling, SOB or lightheadedness with hypotension: No Has patient had a PCN reaction causing severe rash involving mucus membranes or skin necrosis: No Has patient had a PCN reaction that required hospitalization: No Has patient had a PCN reaction  occurring within the last 10 years: No If all of the above answers are "NO", then may proceed with Cephalosporin use. Break out with welts   Tramadol    vomiting      Medication List    STOP taking these medications   naproxen sodium 220 MG tablet Commonly known as:  ANAPROX     TAKE these medications   albuterol 108 (90 Base) MCG/ACT inhaler Commonly known as:  PROVENTIL HFA;VENTOLIN HFA Inhale 1-2 puffs into the lungs every 6 (six) hours as needed for wheezing or shortness of breath.   ASPIR-LOW 81 MG EC tablet Generic drug:  aspirin Take 81 mg by mouth daily.   BREO ELLIPTA 100-25 MCG/INH Aepb Generic drug:  fluticasone furoate-vilanterol Inhale 1 puff into the lungs at bedtime.   cetirizine 10 MG tablet Commonly known as:  ZYRTEC Take 10 mg by mouth daily.   DEXILANT 60 MG capsule Generic drug:  dexlansoprazole Take 60 mg by mouth daily.   doxycycline 100 MG tablet Commonly known as:  VIBRA-TABS Take 1 tablet (100 mg total) by mouth every 12 (twelve) hours.   furosemide 40 MG tablet Commonly known as:  LASIX Take 40 mg by mouth daily.   KLOR-CON M20 20 MEQ tablet Generic drug:  potassium chloride SA Take 20 mEq by mouth daily.   losartan 50 MG tablet Commonly known as:  COZAAR Take 50 mg by mouth daily.   polyethylene glycol packet Commonly known as:  MIRALAX / GLYCOLAX Take 17 g by mouth at bedtime.   spironolactone 25 MG tablet Commonly known as:  ALDACTONE Take 25 mg by mouth daily.   SYNTHROID 25 MCG tablet Generic drug:  levothyroxine Take 25 mcg by mouth daily.   tetrahydrozoline 0.05 % ophthalmic solution Place 1 drop into both eyes at bedtime.   ZENPEP 20000-63000 units Cpep Generic drug:  Pancrelipase (Lip-Prot-Amyl) Take 1 capsule by mouth 3 (three) times daily before meals.            Discharge Care Instructions        Start     Ordered   10/31/16 0000  doxycycline (VIBRA-TABS) 100 MG tablet  Every 12 hours     10/31/16  1350   10/31/16 0000  Increase activity slowly     10/31/16 1350   10/31/16 0000  Diet - low sodium heart healthy     10/31/16 1350   10/31/16 0000  Call MD for:  temperature >100.4     10/31/16 1350   10/31/16 0000  Call MD for:  persistant nausea and vomiting     10/31/16 1350   10/31/16 0000  Call MD for:  severe uncontrolled pain     10/31/16 1350   10/31/16 0000  Call MD for:  difficulty breathing, headache or visual disturbances     10/31/16 1350   10/31/16 0000  Call MD for:  persistant dizziness  or light-headedness     10/31/16 1350   10/31/16 0000  Call MD for:  extreme fatigue     10/31/16 1350     Follow-up Information    PCP. Schedule an appointment as soon as possible for a visit in 2 week(s).          Allergies  Allergen Reactions  . Penicillins Itching and Other (See Comments)    Has patient had a PCN reaction causing immediate rash, facial/tongue/throat swelling, SOB or lightheadedness with hypotension: No Has patient had a PCN reaction causing severe rash involving mucus membranes or skin necrosis: No Has patient had a PCN reaction that required hospitalization: No Has patient had a PCN reaction occurring within the last 10 years: No If all of the above answers are "NO", then may proceed with Cephalosporin use.   Break out with welts  . Tramadol     vomiting   Current Discharge Medication List    START taking these medications   Details  doxycycline (VIBRA-TABS) 100 MG tablet Take 1 tablet (100 mg total) by mouth every 12 (twelve) hours. Qty: 12 tablet, Refills: 0      CONTINUE these medications which have NOT CHANGED   Details  albuterol (PROVENTIL HFA;VENTOLIN HFA) 108 (90 Base) MCG/ACT inhaler Inhale 1-2 puffs into the lungs every 6 (six) hours as needed for wheezing or shortness of breath.    aspirin (ASPIR-LOW) 81 MG EC tablet Take 81 mg by mouth daily.    cetirizine (ZYRTEC) 10 MG tablet Take 10 mg by mouth daily.    dexlansoprazole  (DEXILANT) 60 MG capsule Take 60 mg by mouth daily.    fluticasone furoate-vilanterol (BREO ELLIPTA) 100-25 MCG/INH AEPB Inhale 1 puff into the lungs at bedtime.    furosemide (LASIX) 40 MG tablet Take 40 mg by mouth daily.    KLOR-CON M20 20 MEQ tablet Take 20 mEq by mouth daily.    levothyroxine (SYNTHROID) 25 MCG tablet Take 25 mcg by mouth daily.    losartan (COZAAR) 50 MG tablet Take 50 mg by mouth daily.    Pancrelipase, Lip-Prot-Amyl, (ZENPEP) 20000-63000 units CPEP Take 1 capsule by mouth 3 (three) times daily before meals.    polyethylene glycol (MIRALAX / GLYCOLAX) packet Take 17 g by mouth at bedtime.     spironolactone (ALDACTONE) 25 MG tablet Take 25 mg by mouth daily.    tetrahydrozoline 0.05 % ophthalmic solution Place 1 drop into both eyes at bedtime.      STOP taking these medications     naproxen sodium (ANAPROX) 220 MG tablet         Procedures/Studies: Dg Chest 2 View  Result Date: 10/30/2016 CLINICAL DATA:  Generalized weakness.  Dizziness. EXAM: CHEST  2 VIEW COMPARISON:  None. FINDINGS: Normal sized heart. Clear lungs with normal vascularity. Thoracic spine degenerative changes. IMPRESSION: No acute abnormality. Electronically Signed   By: Beckie Salts M.D.   On: 10/30/2016 11:50   Ct Head Wo Contrast  Result Date: 10/30/2016 CLINICAL DATA:  Dizziness, hot and diaphoresis since last night. EXAM: CT HEAD WITHOUT CONTRAST TECHNIQUE: Contiguous axial images were obtained from the base of the skull through the vertex without intravenous contrast. COMPARISON:  None. FINDINGS: Brain: Diffusely enlarged ventricles and subarachnoid spaces. Patchy white matter low density in both cerebral hemispheres. No intracranial hemorrhage, mass lesion or CT evidence of acute infarction. Vascular: No hyperdense vessel or unexpected calcification. Skull: Bilateral hyperostosis frontalis. Sinuses/Orbits: Sphenoid sinus mucosal thickening and minimal inferior frontal sinus  mucosal  thickening bilaterally. Other: None. IMPRESSION: 1. No acute abnormality. 2. Mild diffuse cerebral atrophy. 3. Mild chronic small vessel white matter ischemic changes in both cerebral hemispheres. 4. Chronic sphenoid sinusitis and minimal chronic bilateral frontal sinusitis. Electronically Signed   By: Beckie Salts M.D.   On: 10/30/2016 15:21   Ct Angio Chest Pe W And/or Wo Contrast  Result Date: 10/31/2016 CLINICAL DATA:  83 y/o F; PE suspected, intermediate prob, positive D-dimer. EXAM: CT ANGIOGRAPHY CHEST WITH CONTRAST TECHNIQUE: Multidetector CT imaging of the chest was performed using the standard protocol during bolus administration of intravenous contrast. Multiplanar CT image reconstructions and MIPs were obtained to evaluate the vascular anatomy. CONTRAST:  60cc Isovue 370 COMPARISON:  None. FINDINGS: Cardiovascular: Satisfactory opacification of the pulmonary arteries to the segmental level. No evidence of pulmonary embolism. Normal heart size. No pericardial effusion. Mild calcific atherosclerosis of the thoracic aorta. Mediastinum/Nodes: Partially visualized large right lobe of thyroid. No mediastinal lymphadenopathy. Normal thoracic esophagus. Calcified left hilar lymph nodes. The Lungs/Pleura: Scattered calcified granulomas in the lungs. No consolidation, effusion, or pneumothorax. Upper Abdomen: Partially visualized cysts in the right renal fossa measuring at least 11.7 cm probably arising from the kidney. Musculoskeletal: No chest wall abnormality. No acute or significant osseous findings. Moderate discogenic degenerative changes of the thoracic spine. Review of the MIP images confirms the above findings. IMPRESSION: 1. No pulmonary embolus identified. 2. Sequelae of granulomatous disease with calcified left hilar lymph nodes and scattered calcified pulmonary granuloma. 3. Otherwise unremarkable CTA of the chest for age. Electronically Signed   By: Mitzi Hansen M.D.   On: 10/31/2016  02:23      Subjective: Pt says she feels much better now and back to her baseline after bp improved.  No chest pain and no SOB.    Discharge Exam: Vitals:   10/31/16 0529 10/31/16 0809  BP: 120/70 (!) 144/74  Pulse: (!) 56 64  Resp: 18   Temp: 98.7 F (37.1 C)   SpO2: 98% 100%   Vitals:   10/30/16 2200 10/30/16 2306 10/31/16 0529 10/31/16 0809  BP: 120/76 (!) 164/77 120/70 (!) 144/74  Pulse: 64 63 (!) 56 64  Resp: 14 16 18    Temp:  98.2 F (36.8 C) 98.7 F (37.1 C)   TempSrc:  Oral Oral   SpO2: 98% 100% 98% 100%  Weight:  82.8 kg (182 lb 7 oz) 82.5 kg (181 lb 14.4 oz)   Height:  5\' 5"  (1.651 m)     General: Pt is alert, awake, oriented x 3, not in acute distress Cardiovascular: RRR, S1/S2 +, no rubs, no gallops Respiratory: CTA bilaterally, no wheezing, no rhonchi Abdominal: Soft, NT, ND, bowel sounds + Extremities: TED hoses bilateral.    The results of significant diagnostics from this hospitalization (including imaging, microbiology, ancillary and laboratory) are listed below for reference.     Microbiology: No results found for this or any previous visit (from the past 240 hour(s)).   Labs: BNP (last 3 results)  Recent Labs  10/30/16 1021  BNP 171.6*   Basic Metabolic Panel:  Recent Labs Lab 10/30/16 1021 10/31/16 0711  NA 133* 138  K 3.9 3.8  CL 99* 106  CO2 25 24  GLUCOSE 88 80  BUN 16 14  CREATININE 1.39* 1.24*  CALCIUM 10.6* 10.4*  MG  --  2.0  PHOS  --  2.9   Liver Function Tests:  Recent Labs Lab 10/30/16 1146 10/31/16 0711  AST 30 30  ALT 16 13*  ALKPHOS 73 57  BILITOT 0.9 1.0  PROT 7.4 6.4*  ALBUMIN 3.8 3.2*   No results for input(s): LIPASE, AMYLASE in the last 168 hours. No results for input(s): AMMONIA in the last 168 hours. CBC:  Recent Labs Lab 10/30/16 1021 10/31/16 0711  WBC 6.9 6.4  NEUTROABS 4.3  --   HGB 14.3 13.8  HCT 42.0 40.6  MCV 91.3 92.3  PLT 247 231   Cardiac Enzymes:  Recent Labs Lab  10/30/16 1537 10/30/16 1924 10/31/16 0207 10/31/16 0711  TROPONINI 0.07* 0.09* 0.11* 0.09*   BNP: Invalid input(s): POCBNP CBG:  Recent Labs Lab 10/31/16 0758  GLUCAP 84   D-Dimer  Recent Labs  10/30/16 1924  DDIMER 2.06*   Hgb A1c  Recent Labs  10/31/16 0207  HGBA1C 5.5   Lipid Profile No results for input(s): CHOL, HDL, LDLCALC, TRIG, CHOLHDL, LDLDIRECT in the last 72 hours. Thyroid function studies  Recent Labs  10/30/16 1146  TSH 0.796   Anemia work up No results for input(s): VITAMINB12, FOLATE, FERRITIN, TIBC, IRON, RETICCTPCT in the last 72 hours. Urinalysis    Component Value Date/Time   COLORURINE COLORLESS (A) 10/30/2016 1012   APPEARANCEUR CLEAR 10/30/2016 1012   LABSPEC 1.003 (L) 10/30/2016 1012   PHURINE 7.0 10/30/2016 1012   GLUCOSEU NEGATIVE 10/30/2016 1012   HGBUR NEGATIVE 10/30/2016 1012   BILIRUBINUR NEGATIVE 10/30/2016 1012   KETONESUR NEGATIVE 10/30/2016 1012   PROTEINUR NEGATIVE 10/30/2016 1012   NITRITE NEGATIVE 10/30/2016 1012   LEUKOCYTESUR NEGATIVE 10/30/2016 1012   Sepsis Labs Invalid input(s): PROCALCITONIN,  WBC,  LACTICIDVEN Microbiology No results found for this or any previous visit (from the past 240 hour(s)).  Time coordinating discharge:   SIGNED:  Standley Dakins, MD  [facility] 10/31/2016, 1:53 PM Pager [phone]  If 7PM-7AM, please contact night-coverage [email or web address] Password [email or web address]

## 2016-10-31 NOTE — Progress Notes (Signed)
  Echocardiogram 2D Echocardiogram has been performed.  Evelyn Wong, Evelyn Wong 10/31/2016, 12:25 PM

## 2016-10-31 NOTE — Discharge Instructions (Signed)
Follow with Primary MD  Patient, No Pcp Per  and other consultant's as instructed your Hospitalist MD  Please get a complete blood count and chemistry panel checked by your Primary MD at your next visit, and again as instructed by your Primary MD.  Get Medicines reviewed and adjusted: Please take all your medications with you for your next visit with your Primary MD  Laboratory/radiological data: Please request your Primary MD to go over all hospital tests and procedure/radiological results at the follow up, please ask your Primary MD to get all Hospital records sent to his/her office.  In some cases, they will be blood work, cultures and biopsy results pending at the time of your discharge. Please request that your primary care M.D. follows up on these results.  Also Note the following: If you experience worsening of your admission symptoms, develop shortness of breath, life threatening emergency, suicidal or homicidal thoughts you must seek medical attention immediately by calling 911 or calling your MD immediately  if symptoms less severe.  You must read complete instructions/literature along with all the possible adverse reactions/side effects for all the Medicines you take and that have been prescribed to you. Take any new Medicines after you have completely understood and accpet all the possible adverse reactions/side effects.   Do not drive when taking Pain medications or sleeping medications (Benzodaizepines)  Do not take more than prescribed Pain, Sleep and Anxiety Medications. It is not advisable to combine anxiety,sleep and pain medications without talking with your primary care practitioner  Special Instructions: If you have smoked or chewed Tobacco  in the last 2 yrs please stop smoking, stop any regular Alcohol  and or any Recreational drug use.  Wear Seat belts while driving.  Please note: You were cared for by a hospitalist during your hospital stay. Once you are discharged,  your primary care physician will handle any further medical issues. Please note that NO REFILLS for any discharge medications will be authorized once you are discharged, as it is imperative that you return to your primary care physician (or establish a relationship with a primary care physician if you do not have one) for your post hospital discharge needs so that they can reassess your need for medications and monitor your lab values.  AVOID  NSAIDS (ibuprofen, naproxen, goody powder, aleve)

## 2016-10-31 NOTE — Discharge Summary (Signed)
Pt got discharged, discharge instructions provided and patient showed understanding to it, IV taken out,Telemonitor DC,pt left unit in wheelchair with all of the belongings. 

## 2016-10-31 NOTE — Progress Notes (Signed)
VASCULAR LAB PRELIMINARY  PRELIMINARY  PRELIMINARY  PRELIMINARY  Bilateral lower extremity venous duplex completed.    Preliminary report:  There is no DVT or SVT noted in the bilateral lower extremities.   Brenly Trawick, RVT 10/31/2016, 12:28 PM

## 2019-05-07 IMAGING — CT CT HEAD W/O CM
4 series · 16 of 47 positions shown, 18 images · non-contrast
Comparison: None.

CLINICAL DATA: Dizziness, hot and diaphoresis since last night.

EXAM:
CT HEAD WITHOUT CONTRAST
TECHNIQUE: Contiguous axial images were obtained from the base of the skull
through the vertex without intravenous contrast.

[Series 3: head without · axial · non-contrast · 0.45mm/px · z∈[-121,+9]mm · 7 of 36 slices shown, 9 images]
[im 5/36  brain]
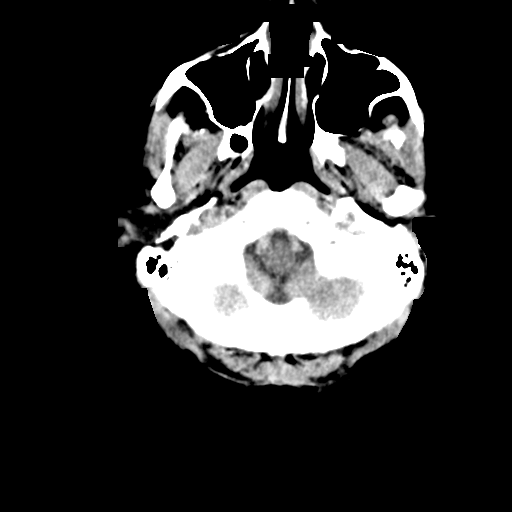
[im 5/36  bone]
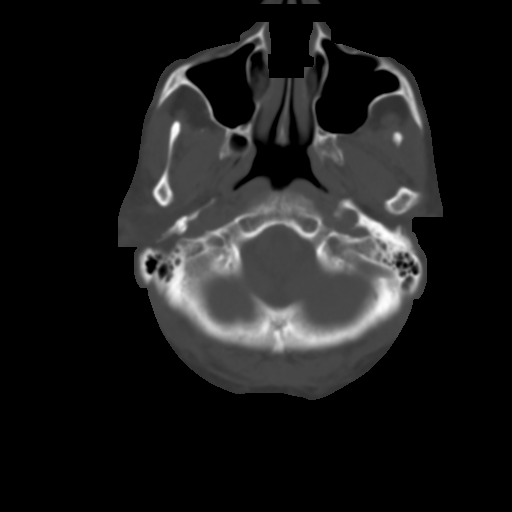
[im 9/36  brain]
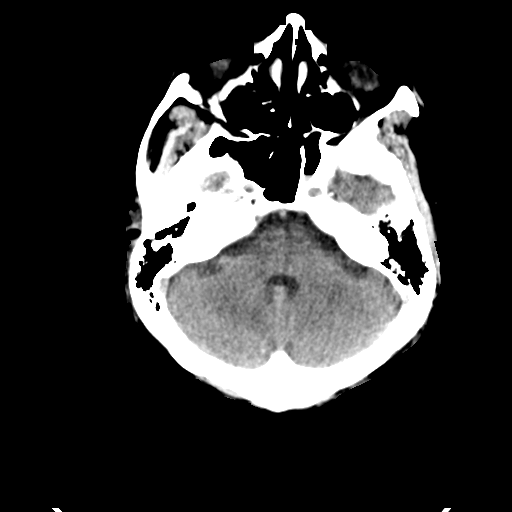
[im 14/36  brain]
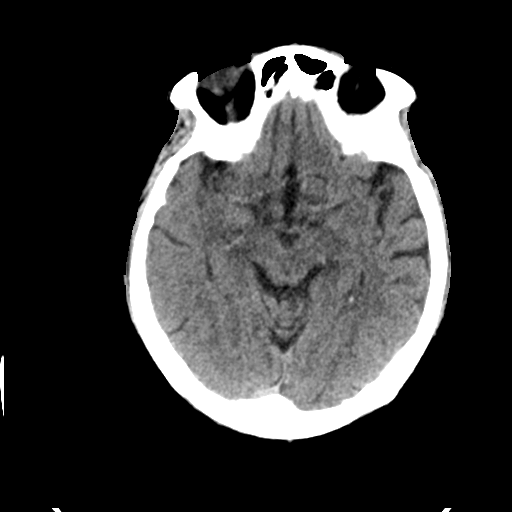
[im 18/36  brain]
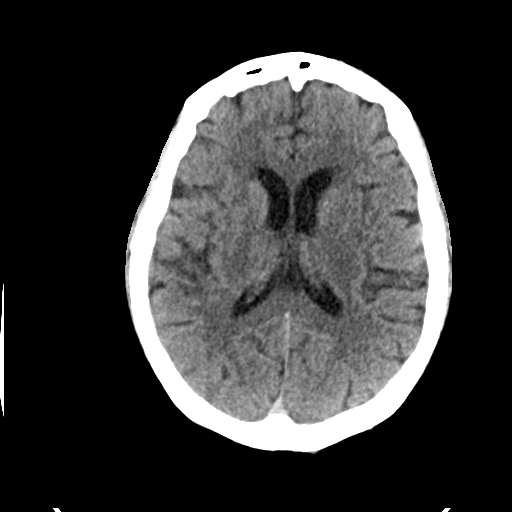
[im 22/36  brain]
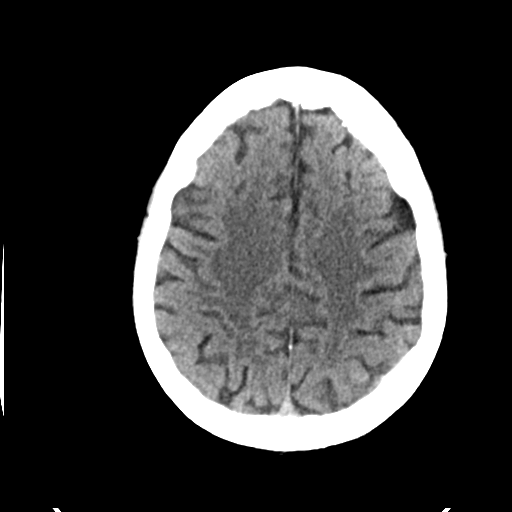
[im 22/36  bone]
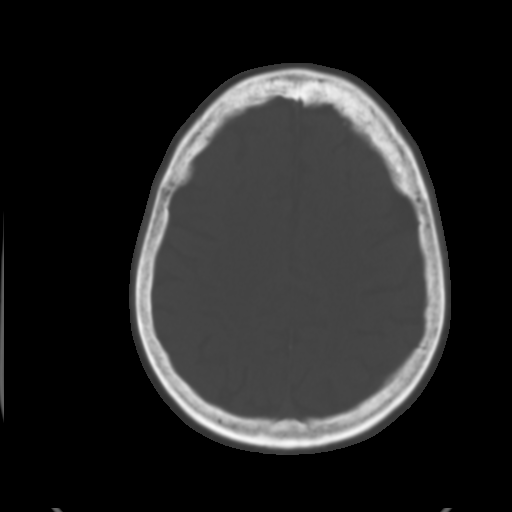
[im 27/36  brain]
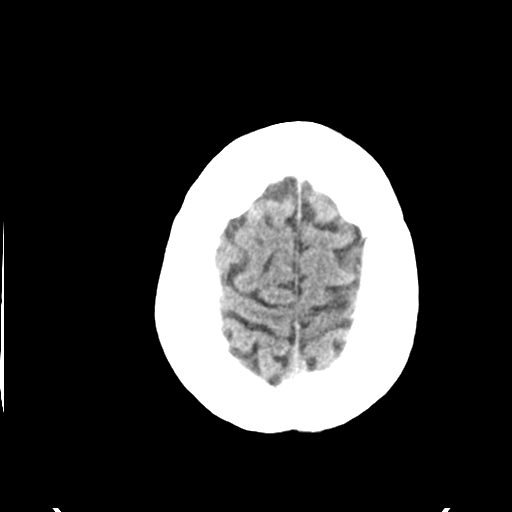
[im 31/36  brain]
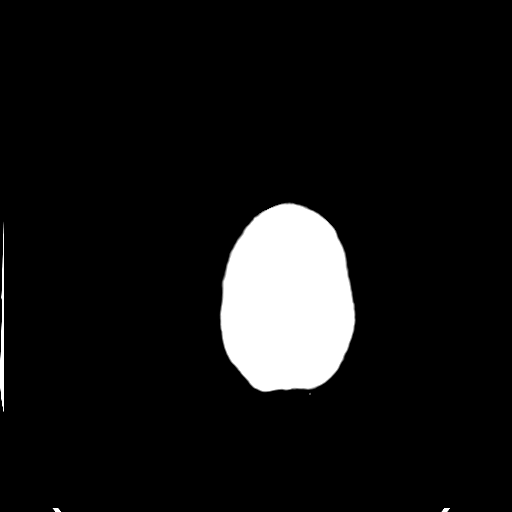

[Series 4: head bone · axial · 0.45mm/px · z∈[-125,-89]mm · 3 of 89 slices shown]
[im 9/89  bone]
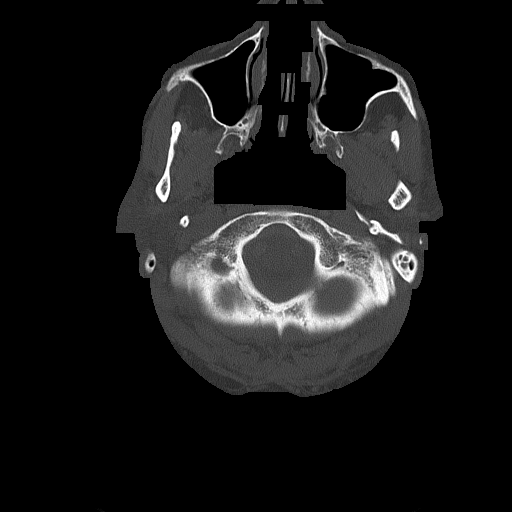
[im 18/89  bone]
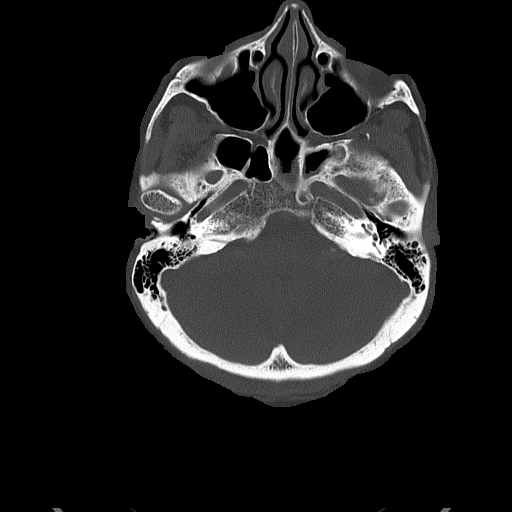
[im 27/89  bone]
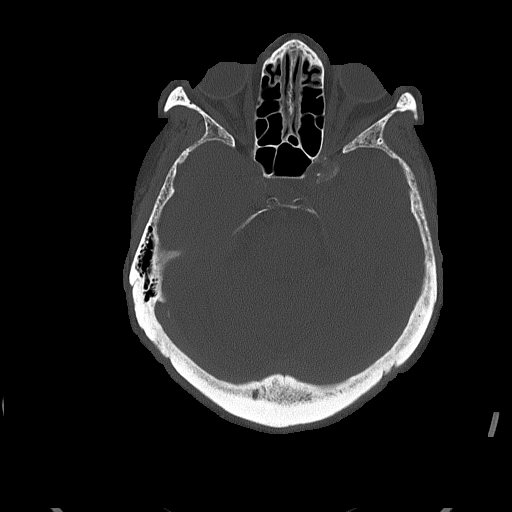

[Series 5: head without cor · coronal · non-contrast · 0.35mm/px · 3 of 65 slices shown]
[im 22/65  brain]
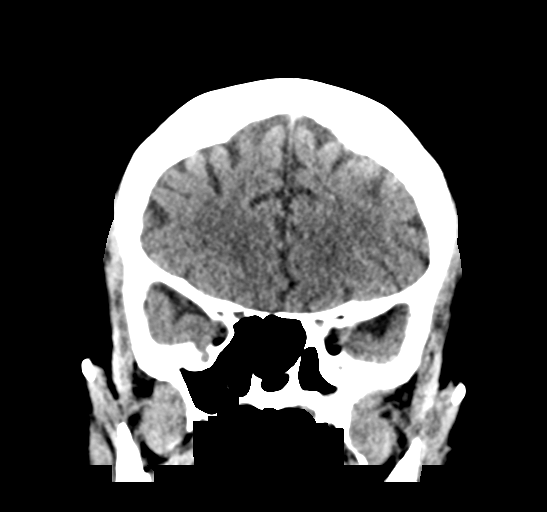
[im 29/65  brain]
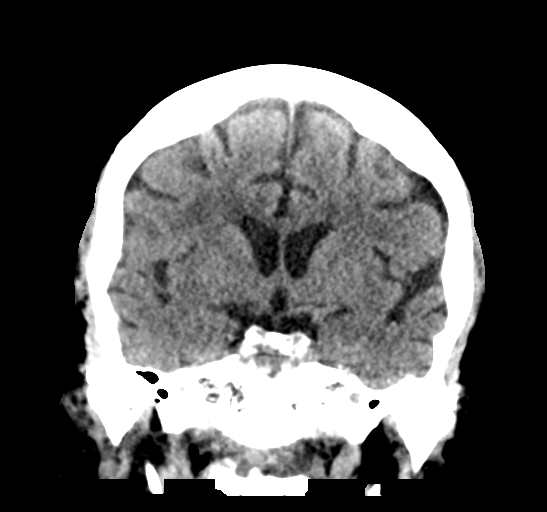
[im 36/65  brain]
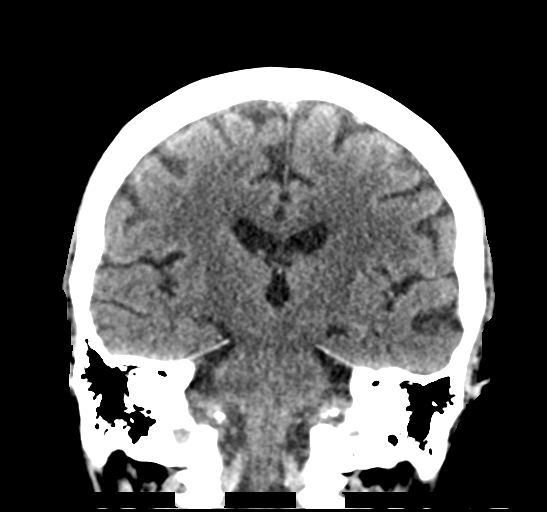

[Series 6: head without sag · sagittal · non-contrast · 0.34mm/px · 3 of 60 slices shown]
[im 20/60  brain]
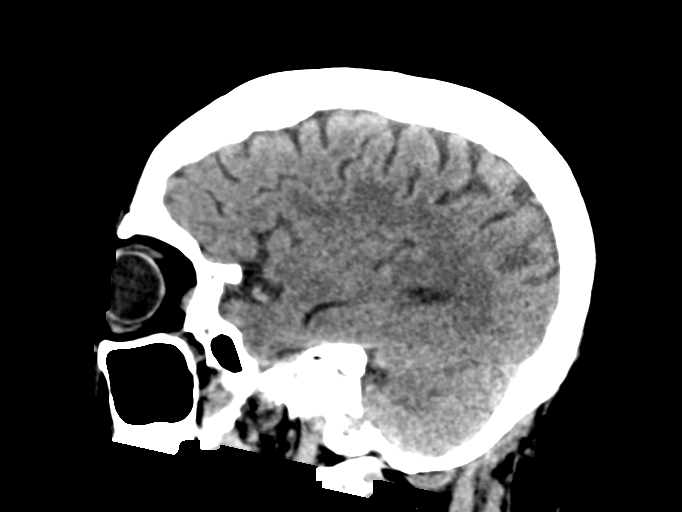
[im 30/60  brain]
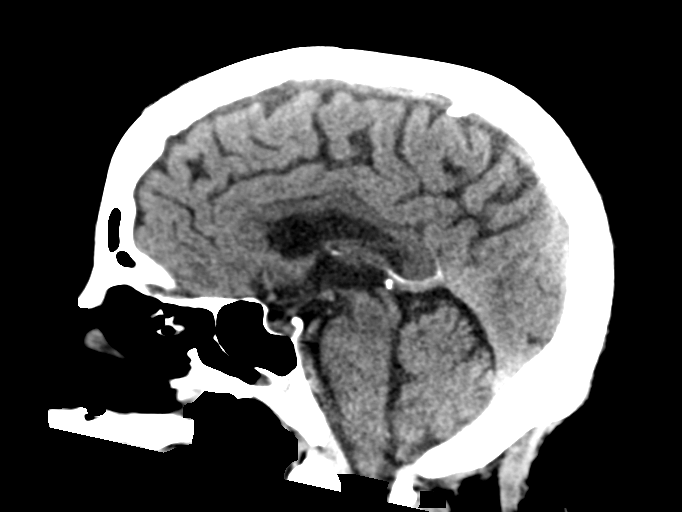
[im 40/60  brain]
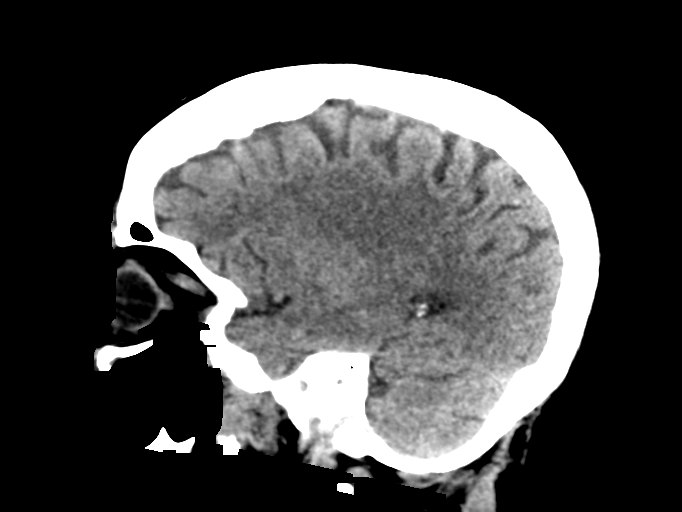

[16 of 47 positions shown; findings below may reference images not displayed]

FINDINGS: Brain: Diffusely enlarged ventricles and subarachnoid spaces. Patchy
white matter low density in both cerebral hemispheres. No
intracranial hemorrhage, mass lesion or CT evidence of acute
infarction.

Vascular: No hyperdense vessel or unexpected calcification.

Skull: Bilateral hyperostosis frontalis.

Sinuses/Orbits: Sphenoid sinus mucosal thickening and minimal
inferior frontal sinus mucosal thickening bilaterally.

Other: None.
IMPRESSION: 1. No acute abnormality.
2. Mild diffuse cerebral atrophy.
3. Mild chronic small vessel white matter ischemic changes in both
cerebral hemispheres.
4. Chronic sphenoid sinusitis and minimal chronic bilateral frontal
sinusitis.

## 2019-05-08 IMAGING — CT CT ANGIO CHEST
2 of 7 series · 19 of 46 positions shown · IV contrast (APPLIED)
Comparison: None.

CLINICAL DATA: [AGE]/o F; PE suspected, intermediate prob, positive
D-dimer.

EXAM:
CT ANGIOGRAPHY CHEST WITH CONTRAST
TECHNIQUE: Multidetector CT imaging of the chest was performed using the
standard protocol during bolus administration of intravenous
contrast. Multiplanar CT image reconstructions and MIPs were
obtained to evaluate the vascular anatomy.
CONTRAST:  60cc Isovue 370

[Series 7: thins · axial · 0.67mm/px · z∈[+1420,+1621]mm · 16 of 323 slices shown]
[im 18/323  lung]
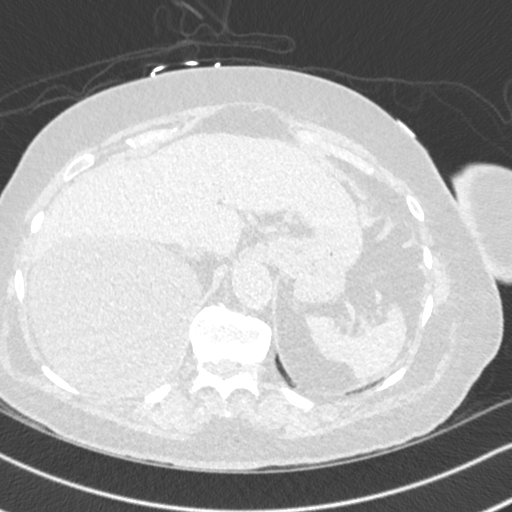
[im 36/323  soft-tissue]
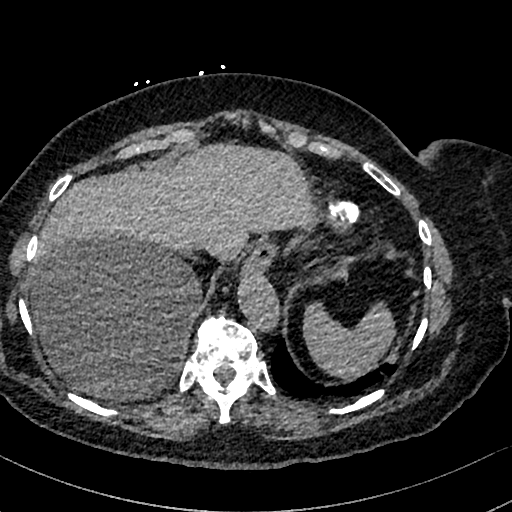
[im 54/323  lung]
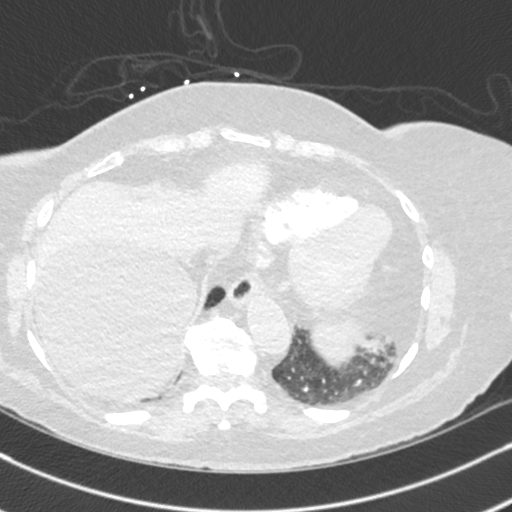
[im 72/323  soft-tissue]
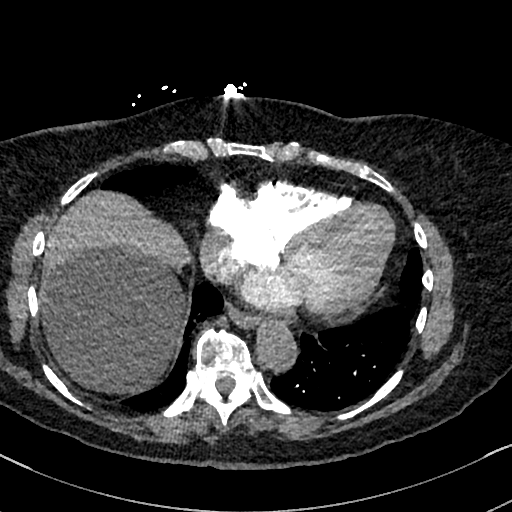
[im 90/323  lung]
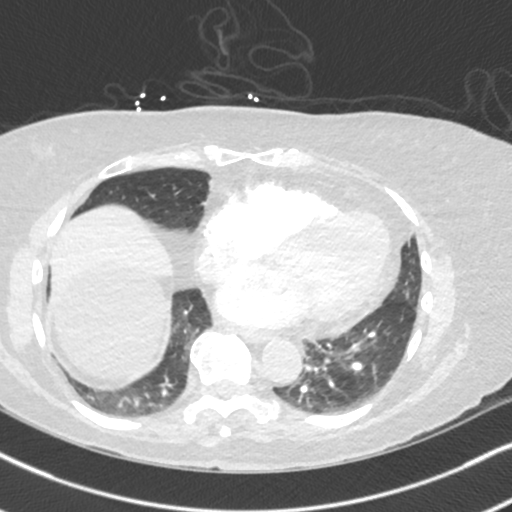
[im 108/323  soft-tissue]
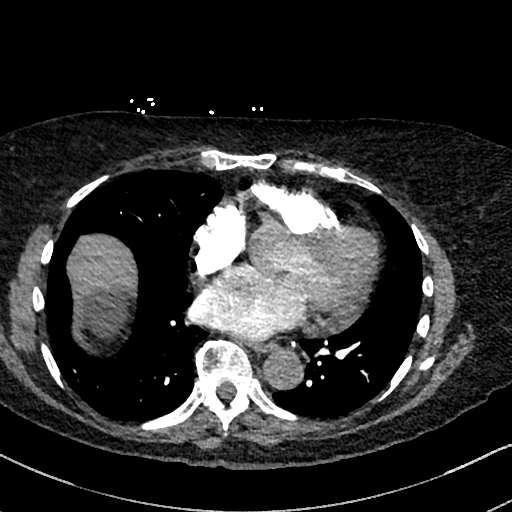
[im 126/323  lung]
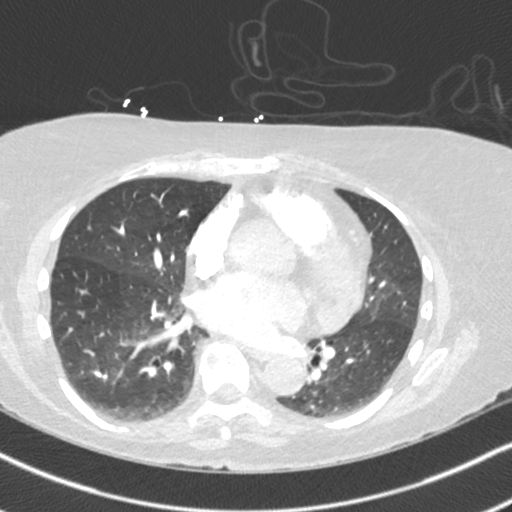
[im 144/323  soft-tissue]
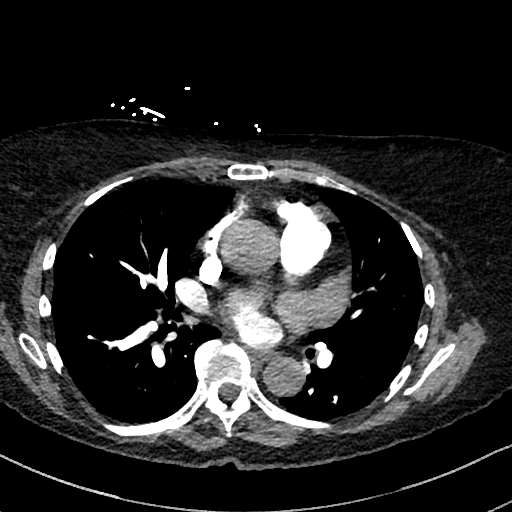
[im 179/323  lung]
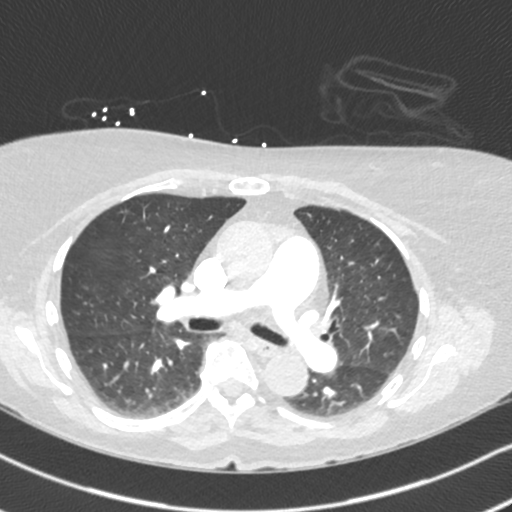
[im 197/323  soft-tissue]
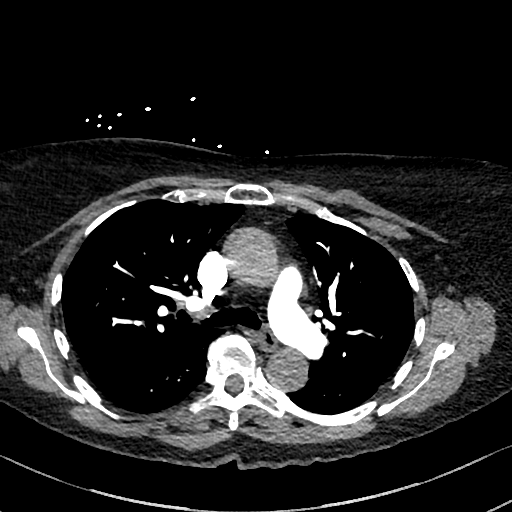
[im 215/323  lung]
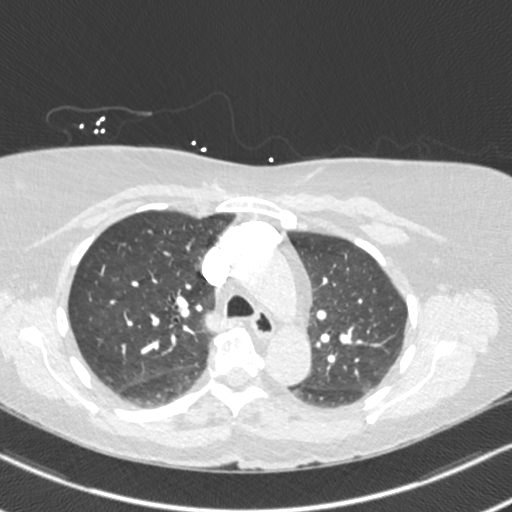
[im 233/323  soft-tissue]
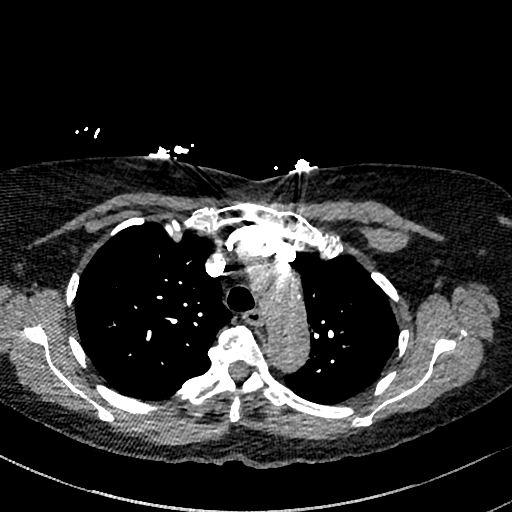
[im 251/323  lung]
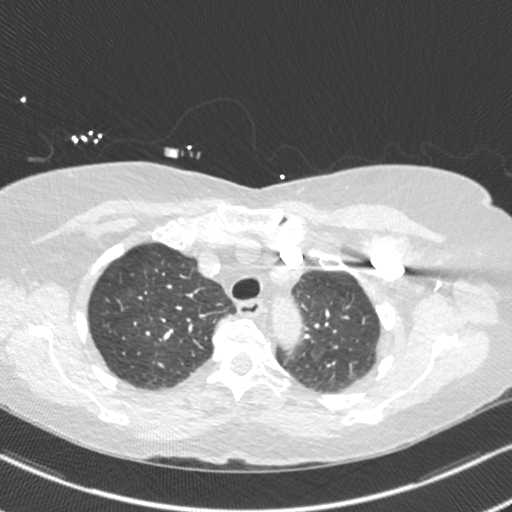
[im 269/323  soft-tissue]
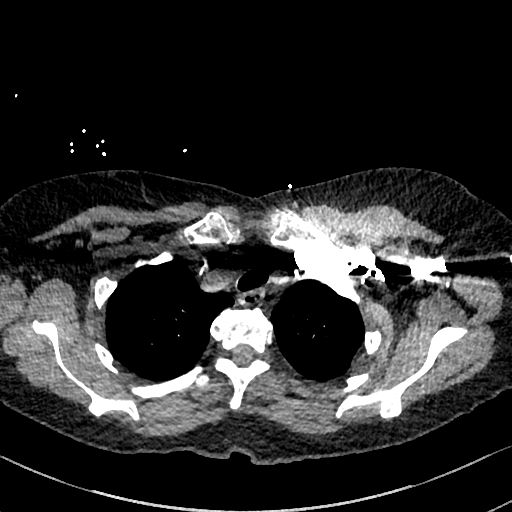
[im 287/323  lung]
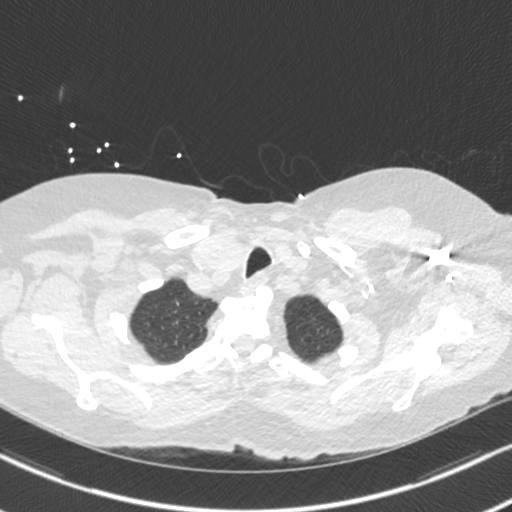
[im 305/323  soft-tissue]
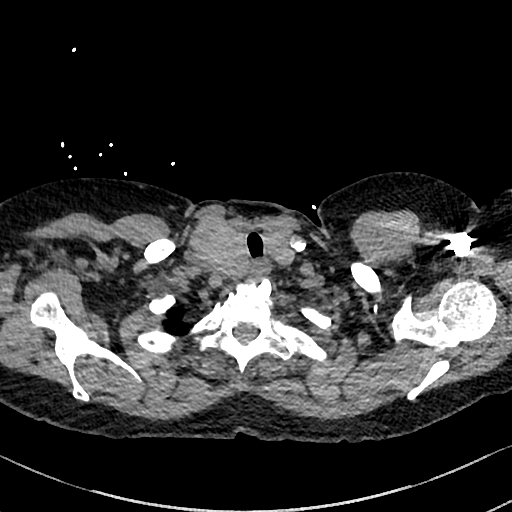

[Series 8: cor · coronal · 0.50mm/px · 3 of 144 slices shown]
[im 36/144  soft-tissue]
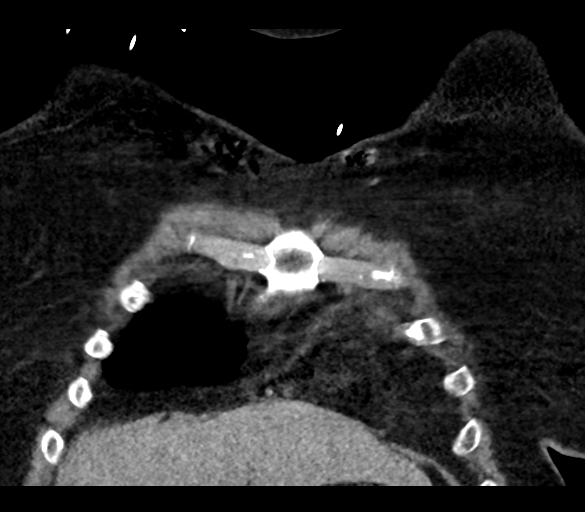
[im 72/144  soft-tissue]
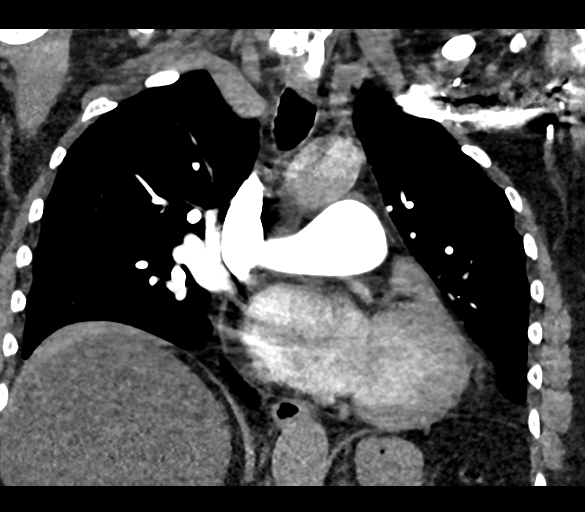
[im 108/144  soft-tissue]
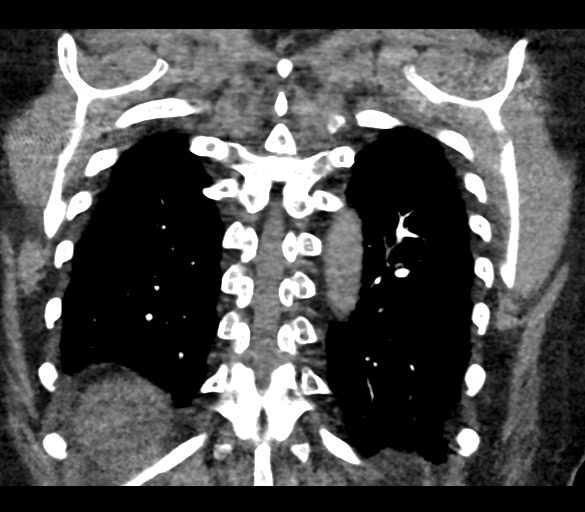

[19 of 46 positions shown; findings below may reference images not displayed]

FINDINGS: Cardiovascular: Satisfactory opacification of the pulmonary arteries
to the segmental level. No evidence of pulmonary embolism. Normal
heart size. No pericardial effusion. Mild calcific atherosclerosis
of the thoracic aorta.

Mediastinum/Nodes: Partially visualized large right lobe of thyroid.
No mediastinal lymphadenopathy. Normal thoracic esophagus. Calcified
left hilar lymph nodes. The

Lungs/Pleura: Scattered calcified granulomas in the lungs. No
consolidation, effusion, or pneumothorax.

Upper Abdomen: Partially visualized cysts in the right renal fossa
measuring at least 11.7 cm probably arising from the kidney.

Musculoskeletal: No chest wall abnormality. No acute or significant
osseous findings. Moderate discogenic degenerative changes of the
thoracic spine.

Review of the MIP images confirms the above findings.
IMPRESSION: 1. No pulmonary embolus identified.
2. Sequelae of granulomatous disease with calcified left hilar lymph
nodes and scattered calcified pulmonary granuloma.
3. Otherwise unremarkable CTA of the chest for age.

By: Larhonda Hight M.D.

## 2023-03-02 DEATH — deceased
